# Patient Record
Sex: Female | Born: 1935
Health system: Southern US, Community
[De-identification: ages and names within clinical notes are randomized; demographics above are authoritative.]

## PROBLEM LIST (undated history)

## (undated) DIAGNOSIS — I272 Pulmonary hypertension, unspecified: Secondary | ICD-10-CM

## (undated) DIAGNOSIS — I10 Essential (primary) hypertension: Secondary | ICD-10-CM

## (undated) DIAGNOSIS — B029 Zoster without complications: Secondary | ICD-10-CM

## (undated) DIAGNOSIS — Z8619 Personal history of other infectious and parasitic diseases: Secondary | ICD-10-CM

## (undated) DIAGNOSIS — I2510 Atherosclerotic heart disease of native coronary artery without angina pectoris: Secondary | ICD-10-CM

## (undated) DIAGNOSIS — I5022 Chronic systolic (congestive) heart failure: Secondary | ICD-10-CM

## (undated) HISTORY — DX: Essential (primary) hypertension: I10

## (undated) HISTORY — DX: Atherosclerotic heart disease of native coronary artery without angina pectoris: I25.10

## (undated) HISTORY — DX: Zoster without complications: B02.9

## (undated) HISTORY — DX: Pulmonary hypertension, unspecified: I27.20

## (undated) HISTORY — DX: Personal history of other infectious and parasitic diseases: Z86.19

## (undated) HISTORY — DX: Chronic systolic (congestive) heart failure: I50.22

---
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
## 2011-03-04 ENCOUNTER — Ambulatory Visit (INDEPENDENT_AMBULATORY_CARE_PROVIDER_SITE_OTHER): Payer: Medicare Other | Admitting: Cardiovascular Disease

## 2011-03-04 ENCOUNTER — Encounter: Payer: Self-pay | Admitting: Cardiovascular Disease

## 2011-03-04 VITALS — BP 170/110 | HR 114 | Ht 64.0 in | Wt 159.0 lb

## 2011-03-04 DIAGNOSIS — R000 Tachycardia, unspecified: Secondary | ICD-10-CM | POA: Insufficient documentation

## 2011-03-04 DIAGNOSIS — I10 Essential (primary) hypertension: Secondary | ICD-10-CM

## 2011-03-04 DIAGNOSIS — R609 Edema, unspecified: Secondary | ICD-10-CM

## 2011-03-04 DIAGNOSIS — R0602 Shortness of breath: Secondary | ICD-10-CM | POA: Insufficient documentation

## 2011-03-04 DIAGNOSIS — I5022 Chronic systolic (congestive) heart failure: Secondary | ICD-10-CM

## 2011-03-04 DIAGNOSIS — I509 Heart failure, unspecified: Secondary | ICD-10-CM

## 2011-03-04 HISTORY — DX: Chronic systolic (congestive) heart failure: I50.22

## 2011-03-04 MED ORDER — FUROSEMIDE 20 MG PO TABS: 20.0000 mg | ORAL_TABLET | Freq: Two times a day (BID) | ORAL | Status: DC | PRN

## 2011-03-04 MED ORDER — METOPROLOL SUCCINATE ER 50 MG PO TB24: 50.0000 mg | ORAL_TABLET | Freq: Every day | ORAL | Status: DC

## 2011-03-04 NOTE — Assessment & Plan Note (Addendum)
Etiology of her congestive heart failure is still uncertain. We will order an echocardiogram to evaluate her cardiac function, including her valves and right heart pressures. Concerned about her tachycardia and hypertension.  Etiology of her symptoms is less likely underlying coronary artery disease given no significant family history and minimal risk factors.

## 2011-03-04 NOTE — Assessment & Plan Note (Signed)
Shortness of breath and edema have improved with diuresis. We have suggested she stay on Lasix 20 mg daily for now. We have given her a prescription for b.i.d. P.r.n. To provide extra as needed. Again etiology is uncertain at this time. She did endorse having a diet high in salt and fluids which she has changed over the past month.

## 2011-03-04 NOTE — Patient Instructions (Addendum)
You are doing well. Please increase the metoprolol to 50 mg daily  We have ordered an echocardiogram for shortness of breath and edema  Please call us if you have new issues that need to be addressed before your next appt.  Your physician wants you to follow-up in: 2 weeks You will receive a reminder letter in the mail two months in advance. If you don't receive a letter, please call our office to schedule the follow-up appointment.  Monitor your heart rate and blood pressure

## 2011-03-04 NOTE — Assessment & Plan Note (Signed)
Heart rate is elevated even later in the exam she continued with heart rate greater than 100. She certainly could have a tachycardia mediated cardiomyopathy. We have asked her to monitor her heart rate closely at home using her blood pressure cuff and pulse oximeter. She is unable to do this, we will order a Holter monitor. We'll increase her metoprolol succinate to 50 mg daily.

## 2011-03-04 NOTE — Assessment & Plan Note (Signed)
Blood pressure is very elevated today. We have asked her to use an alternate blood pressure cuff and to closely monitor her pressure at home. He does report some she is a very anxious person and this is pushing her blood pressure upwards.

## 2011-03-04 NOTE — Progress Notes (Signed)
Patient ID: Giana Castner, female    DOB: 1933/06/12, 78 y.o.   MRN: [id number]  HPI Comments: Ms. Dupas is a very pleasant 78 year old woman with no significant cardiac history, borderline hyperlipidemia, who presents via referral from Dr. Sherrie Mustache for symptoms of shortness of breath and edema.  She reports that her symptoms seemed to start in the middle of December. On December 19, she presented to urgent care which will shortness of breath and edema. She was started on Lasix 40 mg daily with a 9 pound weight loss. She had followup with Dr. Sherrie Mustache and her Lasix was decreased to 20 mg daily. She was also started on metoprolol and this was slowly increased to 37.5 mg daily of the succinate. Overall she is feeling much better, about 90% of her baseline. She denies any further lower extremity edema and no significant shortness of breath when lying supine.  Weight is now down 14 pounds from the middle of December when she had her symptoms.  She denies any chest pain, lightheadedness or dizziness. She has been monitoring her blood pressure at home using a wrist cuff and reports having adequate blood pressures in the 120-130 range systolic. She does not know her heart rate.  In the office today, pressures are very elevated with systolic pressure greater than 170 and diastolic pressure greater than 100 with heart rate greater than 100.  Blood pressure was also noted to be elevated when she was seen by Dr. Sherrie Mustache.  EKG shows sinus tachycardia with rate 114 beats per minute with poor R-wave progression through the anterior precordial leads, nonspecific ST abnormality, left axis deviation   Outpatient Encounter Prescriptions as of 03/04/2011  Medication Sig Dispense Refill  . aspirin 81 MG tablet Take 81 mg by mouth daily.       . Multiple Vitamins-Minerals (MULTIPLE VITAMINS/WOMENS PO) Take by mouth.      . furosemide (LASIX) 40 MG tablet Take 20 mg by mouth daily.      . D metoprolol succinate (TOPROL-XL)  25 MG 24 hr tablet Take 1 and 1/2 tablet daily.         Review of Systems  Constitutional: Negative.   HENT: Negative.   Eyes: Negative.   Respiratory: Negative.   Cardiovascular: Negative.   Gastrointestinal: Negative.   Musculoskeletal: Negative.   Skin: Negative.   Neurological: Negative.   Hematological: Negative.   Psychiatric/Behavioral: Negative.   All other systems reviewed and are negative.    BP 170/110  Pulse 114  Ht 5\' 4"  (1.626 m)  Wt 159 lb (72.122 kg)  BMI 27.29 kg/m2  Physical Exam  Nursing note and vitals reviewed. Constitutional: She is oriented to person, place, and time. She appears well-developed and well-nourished.  HENT:  Head: Normocephalic.  Nose: Nose normal.  Mouth/Throat: Oropharynx is clear and moist.  Eyes: Conjunctivae are normal. Pupils are equal, round, and reactive to light.  Neck: Normal range of motion. Neck supple. No JVD present.  Cardiovascular: Normal rate, regular rhythm, S1 normal, S2 normal, normal heart sounds and intact distal pulses.  Exam reveals no gallop and no friction rub.   No murmur heard. Pulmonary/Chest: Effort normal and breath sounds normal. No respiratory distress. She has no wheezes. She has no rales. She exhibits no tenderness.  Abdominal: Soft. Bowel sounds are normal. She exhibits no distension. There is no tenderness.  Musculoskeletal: Normal range of motion. She exhibits no edema and no tenderness.  Lymphadenopathy:    She has no cervical adenopathy.  Neurological: She is alert and oriented to person, place, and time. Coordination normal.  Skin: Skin is warm and dry. No rash noted. No erythema.  Psychiatric: She has a normal mood and affect. Her behavior is normal. Judgment and thought content normal.         Assessment and Plan

## 2011-03-17 ENCOUNTER — Other Ambulatory Visit (INDEPENDENT_AMBULATORY_CARE_PROVIDER_SITE_OTHER): Payer: Medicare Other | Admitting: *Deleted

## 2011-03-17 DIAGNOSIS — I429 Cardiomyopathy, unspecified: Secondary | ICD-10-CM

## 2011-03-17 DIAGNOSIS — I369 Nonrheumatic tricuspid valve disorder, unspecified: Secondary | ICD-10-CM

## 2011-03-17 DIAGNOSIS — R0602 Shortness of breath: Secondary | ICD-10-CM

## 2011-03-17 DIAGNOSIS — R609 Edema, unspecified: Secondary | ICD-10-CM

## 2011-03-17 MED ORDER — POTASSIUM CHLORIDE ER 10 MEQ PO TBCR: 20.0000 meq | EXTENDED_RELEASE_TABLET | Freq: Two times a day (BID) | ORAL | Status: DC

## 2011-03-17 NOTE — Patient Instructions (Signed)
Take Lasix (Furosemide) 20mg  TWICE a day. (One in AM & one in PM).  START Potassium 20 meq TWICE a day with the Lasix.  Follow up on Monday as already scheduled.

## 2011-03-23 ENCOUNTER — Encounter: Payer: Self-pay | Admitting: *Deleted

## 2011-03-23 ENCOUNTER — Ambulatory Visit (INDEPENDENT_AMBULATORY_CARE_PROVIDER_SITE_OTHER): Payer: Medicare Other | Admitting: Cardiovascular Disease

## 2011-03-23 VITALS — BP 169/119 | HR 96 | Ht 64.0 in | Wt 150.0 lb

## 2011-03-23 DIAGNOSIS — R609 Edema, unspecified: Secondary | ICD-10-CM

## 2011-03-23 DIAGNOSIS — I5020 Unspecified systolic (congestive) heart failure: Secondary | ICD-10-CM

## 2011-03-23 DIAGNOSIS — R000 Tachycardia, unspecified: Secondary | ICD-10-CM

## 2011-03-23 DIAGNOSIS — I10 Essential (primary) hypertension: Secondary | ICD-10-CM

## 2011-03-23 DIAGNOSIS — R0602 Shortness of breath: Secondary | ICD-10-CM

## 2011-03-23 MED ORDER — LISINOPRIL 20 MG PO TABS: 20.0000 mg | ORAL_TABLET | Freq: Every day | ORAL | Status: DC

## 2011-03-23 MED ORDER — METOPROLOL SUCCINATE ER 50 MG PO TB24: 50.0000 mg | ORAL_TABLET | Freq: Two times a day (BID) | ORAL | Status: DC

## 2011-03-23 NOTE — Assessment & Plan Note (Signed)
Edema has improved with significant weight loss over 2 months.

## 2011-03-23 NOTE — Progress Notes (Signed)
Patient ID: Anna Weaver, female    DOB: 27-Mar-1933, 78 y.o.   MRN: [id number]  HPI Comments: Anna Weaver is a very pleasant 77 year old woman with no significant cardiac history, borderline hyperlipidemia, who presents via referral from Dr. Sherrie Mustache for symptoms of shortness of breath and edema. She was found to be in systolic CHF, started on Lasix with a 20 pound weight loss over the past 2 months.  Possible hypertensive cardiomyopathy. No significant risk factors. Recent echocardiogram last week showed ejection fraction less than 25%. Right ventricular systolic pressures were significantly elevated, estimated at 60 mm of mercury or higher.  She has had 9 pound weight loss since her last clinic visit over 2 weeks ago. She feels well. She denies any significant lower extremity edema, normal swelling or cough. Overall she has no complaints. She is not very active.  She denies any chest pain, lightheadedness or dizziness. She has been monitoring her blood pressure at home using a wrist cuff and reports having adequate blood pressures in the 130 range systolic.  Recent lab work showed total cholesterol 205, LDL 119, triglycerides 132, HDL 60   Outpatient Encounter Prescriptions as of 03/23/2011  Medication Sig Dispense Refill  . aspirin 81 MG tablet Take 81 mg by mouth daily.       . furosemide (LASIX) 20 MG tablet Take 1 tablet (20 mg total) by mouth 2 (two) times daily.  180 tablet  3  . Multiple Vitamins-Minerals (MULTIPLE VITAMINS/WOMENS PO) Take by mouth.      . potassium chloride (K-DUR) 10 MEQ tablet Take 2 tablets (20 mEq total) by mouth 2 (two) times daily.  120 tablet  3  . metoprolol succinate (TOPROL-XL) 50 MG 24 hr tablet Take 1 tablet (50 mg total) by mouth daily.  30 tablet  6      Review of Systems  Constitutional: Negative.   HENT: Negative.   Eyes: Negative.   Respiratory: Negative.   Cardiovascular: Negative.   Gastrointestinal: Negative.   Musculoskeletal: Negative.     Skin: Negative.   Neurological: Negative.   Hematological: Negative.   Psychiatric/Behavioral: Negative.   All other systems reviewed and are negative.    BP 169/119  Pulse 96  Ht 5\' 4"  (1.626 m)  Wt 150 lb (68.04 kg)  BMI 25.75 kg/m2  Physical Exam  Nursing note and vitals reviewed. Constitutional: She is oriented to person, place, and time. She appears well-developed and well-nourished.  HENT:  Head: Normocephalic.  Nose: Nose normal.  Mouth/Throat: Oropharynx is clear and moist.  Eyes: Conjunctivae are normal. Pupils are equal, round, and reactive to light.  Neck: Normal range of motion. Neck supple. No JVD present.  Cardiovascular: Normal rate, regular rhythm, S1 normal, S2 normal, normal heart sounds and intact distal pulses.  Exam reveals no gallop and no friction rub.   No murmur heard. Pulmonary/Chest: Effort normal and breath sounds normal. No respiratory distress. She has no wheezes. She has no rales. She exhibits no tenderness.  Abdominal: Soft. Bowel sounds are normal. She exhibits no distension. There is no tenderness.  Musculoskeletal: Normal range of motion. She exhibits no edema and no tenderness.  Lymphadenopathy:    She has no cervical adenopathy.  Neurological: She is alert and oriented to person, place, and time. Coordination normal.  Skin: Skin is warm and dry. No rash noted. No erythema.  Psychiatric: She has a normal mood and affect. Her behavior is normal. Judgment and thought content normal.  Assessment and Plan

## 2011-03-23 NOTE — Assessment & Plan Note (Signed)
Heart rate has improved as her heart failure has improved.

## 2011-03-23 NOTE — Assessment & Plan Note (Signed)
Will start lisinopril and increase metoprolol for blood pressure control.

## 2011-03-23 NOTE — Assessment & Plan Note (Addendum)
Etiology of her heart failure is uncertain. She is reluctant to schedule a stress test at this time.  She has few risk factors for coronary artery disease. My suspicion is she has a nonischemic cardiopathy, possibly hypertensive. We will start lisinopril 20 mg daily, continue her on aggressive diuresis given the echo finding showing elevated right ventricular systolic pressure ( double what it should be), and we will increase the metoprolol succinate 50 mg in the morning and 25 mg at night.  We will check a basic metabolic panel today and BNP

## 2011-03-23 NOTE — Patient Instructions (Addendum)
You are doing well. Please start lisinopril 20 mg daily  In about two weeks, add metoprolol 1/2 in the PM Goal weight is 145 pounds  Check your blood pressure at home and write it down.  Please call us if you have new issues that need to be addressed before your next appt.  Your physician wants you to follow-up in: 2 months.  You will receive a reminder letter in the mail two months in advance. If you don't receive a letter, please call our office to schedule the follow-up appointment.

## 2011-03-24 LAB — BASIC METABOLIC PANEL: Glucose: 124 mg/dL — ABNORMAL HIGH (ref 65–99)

## 2011-03-31 ENCOUNTER — Telehealth: Payer: Self-pay | Admitting: Cardiovascular Disease

## 2011-03-31 NOTE — Telephone Encounter (Signed)
Can we figure out where her BMP and BNP went? Was it ordered wrong? May need to repeat if not available

## 2011-03-31 NOTE — Telephone Encounter (Signed)
Calling for lab results. °

## 2011-03-31 NOTE — Telephone Encounter (Signed)
Calling for lab results.  The only lab in the computer is the glucose.  Do you have a bmet or bnp for her that is not in the computer?  Thanks.

## 2011-04-01 ENCOUNTER — Other Ambulatory Visit: Payer: Self-pay

## 2011-04-01 ENCOUNTER — Encounter: Payer: Self-pay | Admitting: Cardiovascular Disease

## 2011-04-01 DIAGNOSIS — R0602 Shortness of breath: Secondary | ICD-10-CM

## 2011-04-01 NOTE — Telephone Encounter (Signed)
Notified patient per Dr. Mariah Milling need to have a BNP today.  She was sent to [facility] for a BNP.

## 2011-04-01 NOTE — Telephone Encounter (Signed)
N/A.  LMTC. 

## 2011-04-01 NOTE — Telephone Encounter (Signed)
No need to draw a new BNP I am on with the BMP we have Would continue lasix as she is taking. Labs do not suggest dehydration. Can push weight a little lower.

## 2011-04-01 NOTE — Telephone Encounter (Signed)
PT'S DAUGHTER  AWARE OF   LAB  RESULTS./CY 

## 2011-04-01 NOTE — Telephone Encounter (Signed)
The BMP is now available but the patient will need to have the BNP done.

## 2011-04-02 ENCOUNTER — Telehealth: Payer: Self-pay | Admitting: *Deleted

## 2011-04-02 ENCOUNTER — Encounter: Payer: Self-pay | Admitting: Cardiovascular Disease

## 2011-04-02 DIAGNOSIS — R0602 Shortness of breath: Secondary | ICD-10-CM

## 2011-04-02 DIAGNOSIS — Z01810 Encounter for preprocedural cardiovascular examination: Secondary | ICD-10-CM

## 2011-04-02 NOTE — Telephone Encounter (Signed)
[name], RN called to let Dr. Mariah Milling know that [name] lab results for her bnp were in and her bnp is 2509. I told Eunice Blase that I will print off lab and show to Dr. Mariah Milling today to address. Danielle Rankin

## 2011-04-29 ENCOUNTER — Ambulatory Visit (INDEPENDENT_AMBULATORY_CARE_PROVIDER_SITE_OTHER): Payer: Medicare Other | Admitting: Cardiovascular Disease

## 2011-04-29 ENCOUNTER — Encounter: Payer: Self-pay | Admitting: Cardiovascular Disease

## 2011-04-29 VITALS — BP 179/103 | HR 79 | Wt 147.0 lb

## 2011-04-29 DIAGNOSIS — I428 Other cardiomyopathies: Secondary | ICD-10-CM

## 2011-04-29 DIAGNOSIS — I10 Essential (primary) hypertension: Secondary | ICD-10-CM

## 2011-04-29 DIAGNOSIS — R0602 Shortness of breath: Secondary | ICD-10-CM

## 2011-04-29 DIAGNOSIS — I5020 Unspecified systolic (congestive) heart failure: Secondary | ICD-10-CM

## 2011-04-29 DIAGNOSIS — R000 Tachycardia, unspecified: Secondary | ICD-10-CM

## 2011-04-29 MED ORDER — LISINOPRIL 40 MG PO TABS: 40.0000 mg | ORAL_TABLET | Freq: Every day | ORAL | Status: DC

## 2011-04-29 NOTE — Patient Instructions (Addendum)
You are doing well. Please increase the lisinopril to 40 mg daily (or 20 mg twice a day)  We will check some blood work today We will also set up a stress test to look for heart blockage You will be scheduled for a [employer] at [facility].  ECHO should be done prior to your next clinic visit to look at heart function  Please call us if you have new issues that need to be addressed before your next appt.  Your physician wants you to follow-up in: 3 months.  You will receive a reminder letter in the mail two months in advance. If you don't receive a letter, please call our office to schedule the follow-up appointment.

## 2011-04-29 NOTE — Progress Notes (Signed)
Patient ID: Anna Weaver, female    DOB: 03-12-1933, 78 y.o.   MRN: [id number]  HPI Comments: Anna Weaver is a very pleasant 78 year old woman with no significant cardiac history, borderline hyperlipidemia, who presents via referral from Dr. Sherrie Mustache for symptoms of shortness of breath and edema. She was found to be in systolic CHF, started on Lasix with a 20 pound weight loss over the past 2 months. Possible hypertensive or viral cardiomyopathy. No significant risk factors for CAD. Recent echocardiogram showed ejection fraction less than 25%. Right ventricular systolic pressures were significantly elevated, estimated at 60 mm of mercury or higher.  She has had initial  9 pound weight loss on her last visit. 3 lb weight loss since her last visit.  She feels well. She denies any significant lower extremity edema, normal swelling or cough. Overall she has no complaints. She is not very active.  She denies any chest pain, lightheadedness or dizziness. She has been monitoring her blood pressure at home using a wrist cuff and reports having adequate blood pressures in the 120 to 130 range systolic.   lab work showed total cholesterol 205, LDL 119, triglycerides 132, HDL 60     Outpatient Encounter Prescriptions as of 04/29/2011  Medication Sig Dispense Refill  . aspirin 81 MG tablet Take 81 mg by mouth daily.       . furosemide (LASIX) 20 MG tablet Take 1 tablet (20 mg total) by mouth 2 (two) times daily.  180 tablet  3  . lisinopril (PRINIVIL,ZESTRIL) 40 MG tablet Take 1 tablet (40 mg total) by mouth daily.  90 tablet  3  . metoprolol succinate (TOPROL-XL) 50 MG 24 hr tablet Take by mouth. Take 1 tablet in the am and 1 & 1/2 tablet in the pm      . Multiple Vitamins-Minerals (MULTIPLE VITAMINS/WOMENS PO) Take by mouth.      . potassium chloride (K-DUR) 10 MEQ tablet Take 2 tablets (20 mEq total) by mouth 2 (two) times daily.  120 tablet  3  .  lisinopril (PRINIVIL,ZESTRIL) 20 MG tablet Take 1 tablet  (20 mg total) by mouth daily.  30 tablet  11     Review of Systems  Constitutional: Negative.   HENT: Negative.   Eyes: Negative.   Respiratory: Negative.   Cardiovascular: Negative.   Gastrointestinal: Negative.   Musculoskeletal: Negative.   Skin: Negative.   Neurological: Negative.   Hematological: Negative.   Psychiatric/Behavioral: Negative.   All other systems reviewed and are negative.    BP 179/103  Pulse 79  Wt 147 lb (66.679 kg)  Physical Exam  Nursing note and vitals reviewed. Constitutional: She is oriented to person, place, and time. She appears well-developed and well-nourished.  HENT:  Head: Normocephalic.  Nose: Nose normal.  Mouth/Throat: Oropharynx is clear and moist.  Eyes: Conjunctivae are normal. Pupils are equal, round, and reactive to light.  Neck: Normal range of motion. Neck supple. No JVD present.  Cardiovascular: Normal rate, regular rhythm, S1 normal, S2 normal, normal heart sounds and intact distal pulses.  Exam reveals no gallop and no friction rub.   No murmur heard. Pulmonary/Chest: Effort normal and breath sounds normal. No respiratory distress. She has no wheezes. She has no rales. She exhibits no tenderness.  Abdominal: Soft. Bowel sounds are normal. She exhibits no distension. There is no tenderness.  Musculoskeletal: Normal range of motion. She exhibits no edema and no tenderness.  Lymphadenopathy:    She has no cervical adenopathy.  Neurological: She is alert and oriented to person, place, and time. Coordination normal.  Skin: Skin is warm and dry. No rash noted. No erythema.  Psychiatric: She has a normal mood and affect. Her behavior is normal. Judgment and thought content normal.         Assessment and Plan

## 2011-04-29 NOTE — Assessment & Plan Note (Signed)
We will check basic metabolic panel today given recent weight loss. She is concerned about too much diuretic and weight loss. If creatinine and BUN started to climb, we will back down on her diuretic regimen.   We will schedule her for a stress test to rule out ischemia as a cause of her cardiomyopathy.   We will repeat echocardiogram in 3 months. His ejection fraction continues to be less than 35%, she may benefit from referral to EP for possible ICD.

## 2011-04-29 NOTE — Assessment & Plan Note (Signed)
Hypertension appeared to be a problem on today's visit though she reports and has documentation of blood pressures at home with systolic pressures in the 120-130 range. I feel she gets anxious in the office. We will increase her lisinopril to 40 mg daily.

## 2011-04-29 NOTE — Assessment & Plan Note (Signed)
Tachycardia has significantly improved on today's visit likely secondary to improved CHF.

## 2011-04-30 LAB — BASIC METABOLIC PANEL
BUN/Creatinine Ratio: 19 (ref 11–26)
BUN: 19 mg/dL (ref 8–27)
CO2: 24 mmol/L (ref 20–32)
Chloride: 101 mmol/L (ref 97–108)
Glucose: 110 mg/dL — ABNORMAL HIGH (ref 65–99)
Potassium: 4.3 mmol/L (ref 3.5–5.2)

## 2011-05-05 ENCOUNTER — Ambulatory Visit: Payer: Self-pay | Admitting: Cardiovascular Disease

## 2011-05-05 DIAGNOSIS — R0602 Shortness of breath: Secondary | ICD-10-CM

## 2011-05-08 ENCOUNTER — Encounter: Payer: Self-pay | Admitting: Cardiovascular Disease

## 2011-05-08 ENCOUNTER — Ambulatory Visit (INDEPENDENT_AMBULATORY_CARE_PROVIDER_SITE_OTHER): Payer: Medicare Other | Admitting: Cardiovascular Disease

## 2011-05-08 VITALS — BP 164/99 | HR 78 | Ht 64.0 in | Wt 146.0 lb

## 2011-05-08 DIAGNOSIS — I10 Essential (primary) hypertension: Secondary | ICD-10-CM

## 2011-05-08 DIAGNOSIS — E785 Hyperlipidemia, unspecified: Secondary | ICD-10-CM

## 2011-05-08 DIAGNOSIS — I5020 Unspecified systolic (congestive) heart failure: Secondary | ICD-10-CM

## 2011-05-08 DIAGNOSIS — E782 Mixed hyperlipidemia: Secondary | ICD-10-CM | POA: Insufficient documentation

## 2011-05-08 DIAGNOSIS — I2510 Atherosclerotic heart disease of native coronary artery without angina pectoris: Secondary | ICD-10-CM

## 2011-05-08 DIAGNOSIS — I25118 Atherosclerotic heart disease of native coronary artery with other forms of angina pectoris: Secondary | ICD-10-CM | POA: Insufficient documentation

## 2011-05-08 MED ORDER — CLOPIDOGREL BISULFATE 75 MG PO TABS: 75.0000 mg | ORAL_TABLET | Freq: Every day | ORAL | Status: DC

## 2011-05-08 MED ORDER — ATORVASTATIN CALCIUM 20 MG PO TABS: 20.0000 mg | ORAL_TABLET | Freq: Every day | ORAL | Status: DC

## 2011-05-08 NOTE — Assessment & Plan Note (Signed)
We'll continue her on beta blocker, ACE inhibitor, diuretic. She feels well today. She will decrease her Lasix to one a day.

## 2011-05-08 NOTE — Assessment & Plan Note (Signed)
Large region of perfusion defect and ischemia seen in the anterior wall on stress test. This is likely the reason for her depressed ejection fraction. We will set up a cardiac catheterization at her convenience at [facility].  We will start Plavix 75 mg daily in addition to aspirin 81 mg daily.

## 2011-05-08 NOTE — Progress Notes (Signed)
   Patient ID: Anna Weaver, female    DOB: 1933/12/07, 78 y.o.   MRN: [id number]  HPI Comments: Ms. Ozanich is a very pleasant 78 year old woman with  borderline hyperlipidemia, patient of Dr. Sherrie Mustache, with recent symptoms of shortness of breath and edema, found to be in systolic CHF, started on Lasix with a significant weight loss over the past several months months.   No significant risk factors.   echocardiogram showed ejection fraction less than 25%. Right ventricular systolic pressures were significantly elevated, estimated at 60 mm of mercury or higher.  Recent stress test showing anterior perfusion wall defect, ischemia, depressed ejection fraction consistent with underlying coronary artery disease of the LAD.  She feels well and has no complaints of chest pain. Shortness of breath is better after diuresis. Her weight continues to decline slowly.  Recent lab work showed total cholesterol 205, LDL 119, triglycerides 132, HDL 60   Outpatient Encounter Prescriptions as of 05/08/2011  Medication Sig Dispense Refill  . aspirin 81 MG tablet Take 81 mg by mouth daily.       . furosemide (LASIX) 20 MG tablet Take 1 tablet (20 mg total) by mouth 2 (two) times daily.  180 tablet  3  . lisinopril (PRINIVIL,ZESTRIL) 40 MG tablet Take 1 tablet (40 mg total) by mouth daily.  90 tablet  3  . metoprolol succinate (TOPROL-XL) 50 MG 24 hr tablet Take by mouth. Take 1 tablet in the am and 1 & 1/2 tablet in the pm      . Multiple Vitamins-Minerals (MULTIPLE VITAMINS/WOMENS PO) Take by mouth.      . potassium chloride (K-DUR) 10 MEQ tablet Take 2 tablets (20 mEq total) by mouth 2 (two) times daily.  120 tablet  3    Review of Systems  Constitutional: Negative.   HENT: Negative.   Eyes: Negative.   Respiratory: Negative.   Cardiovascular: Negative.   Gastrointestinal: Negative.   Musculoskeletal: Negative.   Skin: Negative.   Neurological: Negative.   Hematological: Negative.   Psychiatric/Behavioral:  Negative.   All other systems reviewed and are negative.    BP 164/99  Pulse 78  Ht 5\' 4"  (1.626 m)  Wt 146 lb (66.225 kg)  BMI 25.06 kg/m2  Physical Exam  Nursing note and vitals reviewed. Constitutional: She is oriented to person, place, and time. She appears well-developed and well-nourished.  HENT:  Head: Normocephalic.  Nose: Nose normal.  Mouth/Throat: Oropharynx is clear and moist.  Eyes: Conjunctivae are normal. Pupils are equal, round, and reactive to light.  Neck: Normal range of motion. Neck supple. No JVD present.  Cardiovascular: Normal rate, regular rhythm, S1 normal, S2 normal, normal heart sounds and intact distal pulses.  Exam reveals no gallop and no friction rub.   No murmur heard. Pulmonary/Chest: Effort normal and breath sounds normal. No respiratory distress. She has no wheezes. She has no rales. She exhibits no tenderness.  Abdominal: Soft. Bowel sounds are normal. She exhibits no distension. There is no tenderness.  Musculoskeletal: Normal range of motion. She exhibits no edema and no tenderness.  Lymphadenopathy:    She has no cervical adenopathy.  Neurological: She is alert and oriented to person, place, and time. Coordination normal.  Skin: Skin is warm and dry. No rash noted. No erythema.  Psychiatric: She has a normal mood and affect. Her behavior is normal. Judgment and thought content normal.         Assessment and Plan

## 2011-05-08 NOTE — Patient Instructions (Addendum)
You are doing well. Please start plavix one a day (in addition to aspirin 81 mg once a day) Please start lipitor 20 mg a day  Please call us if you have new issues that need to be addressed before your next appt.  We will schedule a cardiac cath

## 2011-05-08 NOTE — Assessment & Plan Note (Signed)
Blood pressure continues to be elevated. She is anxious today. We have made no medication changes and will monitor her blood pressure.

## 2011-05-08 NOTE — Assessment & Plan Note (Signed)
Given likely coronary artery disease, we'll start Lipitor 20 mg daily. Goal LDL less than 70.

## 2011-05-11 ENCOUNTER — Other Ambulatory Visit: Payer: Self-pay

## 2011-05-11 DIAGNOSIS — R0602 Shortness of breath: Secondary | ICD-10-CM

## 2011-05-11 DIAGNOSIS — Z5181 Encounter for therapeutic drug level monitoring: Secondary | ICD-10-CM

## 2011-05-11 DIAGNOSIS — Z01818 Encounter for other preprocedural examination: Secondary | ICD-10-CM

## 2011-05-11 DIAGNOSIS — Z01810 Encounter for preprocedural cardiovascular examination: Secondary | ICD-10-CM

## 2011-05-21 ENCOUNTER — Ambulatory Visit (INDEPENDENT_AMBULATORY_CARE_PROVIDER_SITE_OTHER): Payer: Medicare Other

## 2011-05-21 DIAGNOSIS — Z01818 Encounter for other preprocedural examination: Secondary | ICD-10-CM

## 2011-05-21 DIAGNOSIS — R0602 Shortness of breath: Secondary | ICD-10-CM

## 2011-05-21 DIAGNOSIS — Z5181 Encounter for therapeutic drug level monitoring: Secondary | ICD-10-CM

## 2011-05-21 DIAGNOSIS — Z01810 Encounter for preprocedural cardiovascular examination: Secondary | ICD-10-CM

## 2011-05-22 LAB — BASIC METABOLIC PANEL
CO2: 25 mmol/L (ref 20–32)
Calcium: 9.9 mg/dL (ref 8.6–10.2)
Chloride: 101 mmol/L (ref 97–108)
GFR calc non Af Amer: 52 mL/min/{1.73_m2} — ABNORMAL LOW (ref >59)
Potassium: 3.9 mmol/L (ref 3.5–5.2)
Sodium: 139 mmol/L (ref 134–144)

## 2011-05-22 LAB — CBC WITH DIFFERENTIAL/PLATELET
Basophils Absolute: 0 10*3/uL (ref 0.0–0.2)
Eosinophils Absolute: 0 10*3/uL (ref 0.0–0.4)
Immature Grans (Abs): 0 10*3/uL (ref 0.0–0.1)
Immature Granulocytes: 0 % (ref 0–2)
MCH: 30 pg (ref 26.6–33.0)
MCV: 87 fL (ref 79–97)
Monocytes Absolute: 0.4 10*3/uL (ref 0.1–1.0)
Neutrophils Relative %: 66 % (ref 40–74)
RDW: 14.7 % (ref 12.3–15.4)

## 2011-05-29 ENCOUNTER — Encounter: Payer: Self-pay | Admitting: Cardiovascular Disease

## 2011-05-29 ENCOUNTER — Ambulatory Visit: Payer: Self-pay | Admitting: Cardiovascular Disease

## 2011-05-29 DIAGNOSIS — R079 Chest pain, unspecified: Secondary | ICD-10-CM

## 2011-05-29 HISTORY — PX: CARDIAC CATHETERIZATION: SHX172

## 2011-06-03 ENCOUNTER — Ambulatory Visit (INDEPENDENT_AMBULATORY_CARE_PROVIDER_SITE_OTHER): Payer: Medicare Other | Admitting: Cardiovascular Disease

## 2011-06-03 ENCOUNTER — Encounter: Payer: Self-pay | Admitting: Cardiovascular Disease

## 2011-06-03 VITALS — BP 168/78 | HR 69 | Ht 64.0 in | Wt 147.0 lb

## 2011-06-03 DIAGNOSIS — E785 Hyperlipidemia, unspecified: Secondary | ICD-10-CM

## 2011-06-03 DIAGNOSIS — I10 Essential (primary) hypertension: Secondary | ICD-10-CM

## 2011-06-03 DIAGNOSIS — I5020 Unspecified systolic (congestive) heart failure: Secondary | ICD-10-CM

## 2011-06-03 DIAGNOSIS — I2510 Atherosclerotic heart disease of native coronary artery without angina pectoris: Secondary | ICD-10-CM

## 2011-06-03 MED ORDER — ATORVASTATIN CALCIUM 20 MG PO TABS: 20.0000 mg | ORAL_TABLET | Freq: Every day | ORAL | Status: DC

## 2011-06-03 NOTE — Assessment & Plan Note (Signed)
She is currently on Lipitor 20 mg daily. Goal LDL less than 70. We will check cholesterol at the end of June

## 2011-06-03 NOTE — Assessment & Plan Note (Signed)
Blood pressure is elevated on today's visit though she reports adequate numbers at home. We have not made any medication changes today and have suggested she monitor her blood pressure.

## 2011-06-03 NOTE — Assessment & Plan Note (Signed)
Occluded LAD by cardiac catheterization. Collaterals from left to left and right-to-left. Continue aggressive cholesterol management and heart failure medications.

## 2011-06-03 NOTE — Assessment & Plan Note (Signed)
Ejection fraction has improved to 40% by cardiac catheterization.  Currently with no symptoms. We'll continue Lasix 20 mg twice a day. Goal weight is 143 pounds to 147 pounds.

## 2011-06-03 NOTE — Patient Instructions (Addendum)
You are doing well. Decrease the potassium to one (10 meq) twice a day  If your weight drops below 145 lbs, hold the lasix and potassium  Goal weight is between 143 and 147  We will check your lipids at the end of June We will check a BMP in June as well. We will try to keep the blood pressure <140 on the top, <90 on the bottom  Please call us if you have new issues that need to be addressed before your next appt.  Your physician wants you to follow-up in: 6 months.  You will receive a reminder letter in the mail two months in advance. If you don't receive a letter, please call our office to schedule the follow-up appointment.

## 2011-06-03 NOTE — Progress Notes (Signed)
Patient ID: Anna Weaver, female    DOB: 02/03/1934, 78 y.o.   MRN: [id number]  HPI Comments: Ms. [name] is a very pleasant 78 year old woman with  borderline hyperlipidemia, patient of Dr. Sherrie Mustache, with recent symptoms of shortness of breath and edema in Jan 2013, found to be in systolic CHF EF 25%, started on Lasix with a significant weight loss over the past several months,  Stress test showing anterior wall ischemia, with recent cardiac catheterization at [facility] on 05/29/2011 showing occluded proximal LAD with collateral vessels, also 60% OM 2 disease, ejection fraction 40%.  She presents for followup after her recent cardiac catheterization. She reports that her leg healed well and she has no complaints. Her weight has been relatively stable in the 145 range. She takes Lasix twice a day with potassium 20 mEq twice a day. She has been active with no significant shortness of breath. Overall she feels well and has no complaints. Blood pressure at home has been in the 120 range systolic.  Previous echocardiogram showed ejection fraction less than 25%. Right ventricular systolic pressures were significantly elevated, estimated at 60 mm of mercury or higher.  Recent stress test showing anterior perfusion wall defect, ischemia, depressed ejection fraction consistent with underlying coronary artery disease of the LAD.  Previous lab work showed total cholesterol 205, LDL 119, triglycerides 132, HDL 60 EKG shows normal sinus rhythm with rate 69 beats per minute with nonspecific ST changes in the inferior and lateral leads, poor R wave progression to the anterior precordial leads, consider old anterior MI.   Outpatient Encounter Prescriptions as of 06/03/2011  Medication Sig Dispense Refill  . aspirin 81 MG tablet Take 81 mg by mouth daily.       Marland Kitchen atorvastatin (LIPITOR) 20 MG tablet Take 1 tablet (20 mg total) by mouth daily.  90 tablet  4  . furosemide (LASIX) 20 MG tablet Take 1 tablet (20 mg total) by  mouth 2 (two) times daily.  180 tablet  3  . lisinopril (PRINIVIL,ZESTRIL) 40 MG tablet Take 1 tablet (40 mg total) by mouth daily.  90 tablet  3  . metoprolol succinate (TOPROL-XL) 50 MG 24 hr tablet Take by mouth. Take 1 tablet in the am and 1 & 1/2 tablet in the pm      . Multiple Vitamins-Minerals (MULTIPLE VITAMINS/WOMENS PO) Take by mouth.      . potassium chloride (K-DUR) 20 MEQ tablet Take 1 tablet (10 mEq total) by mouth 2 (two) times daily.  60 tablet  3   Review of Systems  Constitutional: Negative.   HENT: Negative.   Eyes: Negative.   Respiratory: Negative.   Cardiovascular: Negative.   Gastrointestinal: Negative.   Musculoskeletal: Negative.   Skin: Negative.   Neurological: Negative.   Hematological: Negative.   Psychiatric/Behavioral: Negative.   All other systems reviewed and are negative.    BP 168/78  Pulse 69  Ht 5\' 4"  (1.626 m)  Wt 147 lb (66.679 kg)  BMI 25.23 kg/m2  Physical Exam  Nursing note and vitals reviewed. Constitutional: She is oriented to person, place, and time. She appears well-developed and well-nourished.  HENT:  Head: Normocephalic.  Nose: Nose normal.  Mouth/Throat: Oropharynx is clear and moist.  Eyes: Conjunctivae are normal. Pupils are equal, round, and reactive to light.  Neck: Normal range of motion. Neck supple. No JVD present.  Cardiovascular: Normal rate, regular rhythm, S1 normal, S2 normal, normal heart sounds and intact distal pulses.  Exam reveals no gallop  and no friction rub.   No murmur heard. Pulmonary/Chest: Effort normal and breath sounds normal. No respiratory distress. She has no wheezes. She has no rales. She exhibits no tenderness.  Abdominal: Soft. Bowel sounds are normal. She exhibits no distension. There is no tenderness.  Musculoskeletal: Normal range of motion. She exhibits no edema and no tenderness.  Lymphadenopathy:    She has no cervical adenopathy.  Neurological: She is alert and oriented to person,  place, and time. Coordination normal.  Skin: Skin is warm and dry. No rash noted. No erythema.  Psychiatric: She has a normal mood and affect. Her behavior is normal. Judgment and thought content normal.         Assessment and Plan

## 2011-06-15 ENCOUNTER — Encounter: Payer: Self-pay | Admitting: Cardiovascular Disease

## 2011-07-28 ENCOUNTER — Other Ambulatory Visit: Payer: Medicare Other

## 2011-07-31 ENCOUNTER — Ambulatory Visit: Payer: Medicare Other | Admitting: Cardiovascular Disease

## 2011-08-03 ENCOUNTER — Other Ambulatory Visit: Payer: Medicare Other

## 2011-08-18 ENCOUNTER — Ambulatory Visit (INDEPENDENT_AMBULATORY_CARE_PROVIDER_SITE_OTHER): Payer: Medicare Other

## 2011-08-18 DIAGNOSIS — E785 Hyperlipidemia, unspecified: Secondary | ICD-10-CM

## 2011-08-18 DIAGNOSIS — I2510 Atherosclerotic heart disease of native coronary artery without angina pectoris: Secondary | ICD-10-CM

## 2011-08-19 LAB — HEPATIC FUNCTION PANEL
Alkaline Phosphatase: 108 IU/L (ref 25–165)
Bilirubin, Direct: 0.13 mg/dL (ref 0.00–0.40)
Total Bilirubin: 0.4 mg/dL (ref 0.0–1.2)
Total Protein: 6.6 g/dL (ref 6.0–8.5)

## 2011-08-19 LAB — LIPID PANEL
Chol/HDL Ratio: 2.6 ratio units (ref 0.0–4.4)
HDL: 64 mg/dL (ref >39)
Triglycerides: 85 mg/dL (ref 0–149)

## 2011-08-25 ENCOUNTER — Other Ambulatory Visit: Payer: Self-pay | Admitting: Cardiovascular Disease

## 2011-08-25 NOTE — Telephone Encounter (Signed)
Refill sent for potassuim

## 2011-08-31 ENCOUNTER — Telehealth: Payer: Self-pay

## 2011-08-31 NOTE — Telephone Encounter (Signed)
Sounds good

## 2011-08-31 NOTE — Telephone Encounter (Signed)
See below  Pt called wanting lab results that were drawn 2 weeks ago. I gave her preliminary results and explained these looked good, to stay on atorvastatin for cholesterol.,  She also requests that I mail her these results.    She then tells me she is taking metoprolol 1 tab in am and 1.5 tabs in PM.  She asks if she can change to 1 tab BID since the 0.5 tablet is causing issues with how many pills she gets from pharmacy. She tells me that Dr. Mariah Milling stated her "norm" for BP is 140/90 or less. She checks BP at home and confirms these are the #s she has been getting. I advsied her to try taking just metoprolol 1 tab BID and continue to monitor BP. If her BP readings remain 140/90 or less we may be able to keep her on the 1 tab BID dosing.  She will do this and call us next week with readings.

## 2011-09-07 ENCOUNTER — Telehealth: Payer: Self-pay

## 2011-09-07 MED ORDER — METOPROLOL SUCCINATE ER 50 MG PO TB24: ORAL_TABLET | ORAL | Status: DC

## 2011-09-07 MED ORDER — POTASSIUM CHLORIDE ER 10 MEQ PO TBCR: 10.0000 meq | EXTENDED_RELEASE_TABLET | Freq: Two times a day (BID) | ORAL | Status: DC

## 2011-09-07 NOTE — Telephone Encounter (Signed)
Pt says she tried taking metoprolol 1 tab BID (as discussed last week) instead of 1 tab in am 1 1/2 tabs in pm.  She says BP has been ok but she has been having some dizzy spells in am. She says she  wishes to just go back on the metoprolol 1 tab in am 1 1/2 tabs in pm and wants me to send new RX to pharm with correct amt of tabs.  I told her I would do this now.

## 2011-09-07 NOTE — Telephone Encounter (Signed)
Pt would like me to call her back

## 2011-12-09 ENCOUNTER — Encounter: Payer: Self-pay | Admitting: Cardiovascular Disease

## 2011-12-09 ENCOUNTER — Ambulatory Visit (INDEPENDENT_AMBULATORY_CARE_PROVIDER_SITE_OTHER): Payer: Medicare Other | Admitting: Cardiovascular Disease

## 2011-12-09 VITALS — BP 164/100 | HR 76 | Ht 64.0 in | Wt 149.8 lb

## 2011-12-09 DIAGNOSIS — E785 Hyperlipidemia, unspecified: Secondary | ICD-10-CM

## 2011-12-09 DIAGNOSIS — R609 Edema, unspecified: Secondary | ICD-10-CM

## 2011-12-09 DIAGNOSIS — I2510 Atherosclerotic heart disease of native coronary artery without angina pectoris: Secondary | ICD-10-CM

## 2011-12-09 DIAGNOSIS — I10 Essential (primary) hypertension: Secondary | ICD-10-CM

## 2011-12-09 DIAGNOSIS — R0602 Shortness of breath: Secondary | ICD-10-CM

## 2011-12-09 MED ORDER — ATORVASTATIN CALCIUM 40 MG PO TABS: 40.0000 mg | ORAL_TABLET | Freq: Every day | ORAL | Status: DC

## 2011-12-09 MED ORDER — LISINOPRIL 40 MG PO TABS: 40.0000 mg | ORAL_TABLET | Freq: Every day | ORAL | Status: DC

## 2011-12-09 MED ORDER — FUROSEMIDE 20 MG PO TABS: 20.0000 mg | ORAL_TABLET | Freq: Two times a day (BID) | ORAL | Status: DC

## 2011-12-09 NOTE — Assessment & Plan Note (Signed)
We'll increase the Lipitor to 40 mg daily with goal LDL less than 70.

## 2011-12-09 NOTE — Assessment & Plan Note (Signed)
Shortness of breath has resolved with weight loss on diuretics.

## 2011-12-09 NOTE — Patient Instructions (Addendum)
You are doing well. Please increase the lipitor to 40 mg daily  Recheck the cholesterol in three months   Please call us if you have new issues that need to be addressed before your next appt.  Your physician wants you to follow-up in: 6 months.  You will receive a reminder letter in the mail two months in advance. If you don't receive a letter, please call our office to schedule the follow-up appointment.

## 2011-12-09 NOTE — Assessment & Plan Note (Signed)
Blood pressure is elevated in the office today, well controlled at home. We have suggested she continue to monitor her blood pressure at home and call our office for systolic pressures greater than 140.

## 2011-12-09 NOTE — Assessment & Plan Note (Signed)
Currently with no symptoms of angina. No further workup at this time. Continue current medication regimen. 

## 2011-12-09 NOTE — Assessment & Plan Note (Signed)
Edema has resolved. Continue Lasix twice a day.

## 2011-12-09 NOTE — Progress Notes (Signed)
Patient ID: Anna Weaver, female    DOB: 05/31/1933, 78 y.o.   MRN: [id number]  HPI Comments: Anna Weaver is a very pleasant 78 year old woman with  borderline hyperlipidemia, patient of Dr. Sherrie Mustache, with recent symptoms of shortness of breath and edema in Jan 2013, found to be in systolic CHF EF 25%, started on Lasix with a significant weight loss over the past several months,  Stress test showing anterior wall ischemia, with recent cardiac catheterization at [facility] on 05/29/2011 showing occluded proximal LAD with collateral vessels, also 60% OM 2 disease, ejection fraction 40%.  She reports that she has been doing very well on Lasix 20 mg twice a day. Weight has fluctuated between 147 and 150 pounds. He is very strict with her salt and fluid intake. She reports her blood pressure is well controlled at home with systolic pressures in the 130 range. She is nervous today in the office.  Previous echocardiogram January 2013 showed ejection fraction less than 25%. Right ventricular systolic pressures were significantly elevated, estimated at 60 mm of mercury or higher.  Most recent total cholesterol 164, LDL in the 80s  EKG shows normal sinus rhythm withwith nonspecific ST changes in the inferior and lateral leads, poor R wave progression to the anterior precordial leads, consider old anterior MI.   Outpatient Encounter Prescriptions as of 12/09/2011  Medication Sig Dispense Refill  . aspirin 81 MG tablet Take 81 mg by mouth daily.       . furosemide (LASIX) 20 MG tablet Take 1 tablet (20 mg total) by mouth 2 (two) times daily.  180 tablet  3  . lisinopril (PRINIVIL,ZESTRIL) 40 MG tablet Take 1 tablet (40 mg total) by mouth daily.  90 tablet  3  . metoprolol succinate (TOPROL-XL) 50 MG 24 hr tablet Take as directed  160 tablet  3  . Multiple Vitamins-Minerals (MULTIPLE VITAMINS/WOMENS PO) Take by mouth.      . potassium chloride (KLOR-CON 10) 10 MEQ tablet Take 1 tablet (10 mEq total) by mouth 2 (two)  times daily.  120 tablet  3  . atorvastatin (LIPITOR) 20 MG tablet Take 1 tablet (20 mg total) by mouth daily.  90 tablet  4    Review of Systems  Constitutional: Negative.   HENT: Negative.   Eyes: Negative.   Respiratory: Negative.   Cardiovascular: Negative.   Gastrointestinal: Negative.   Musculoskeletal: Negative.   Skin: Negative.   Neurological: Negative.   Hematological: Negative.   Psychiatric/Behavioral: Negative.   All other systems reviewed and are negative.    BP 164/100  Pulse 76  Ht 5\' 4"  (1.626 m)  Wt 149 lb 12 oz (67.926 kg)  BMI 25.70 kg/m2  Physical Exam  Nursing note and vitals reviewed. Constitutional: She is oriented to person, place, and time. She appears well-developed and well-nourished.  HENT:  Head: Normocephalic.  Nose: Nose normal.  Mouth/Throat: Oropharynx is clear and moist.  Eyes: Conjunctivae normal are normal. Pupils are equal, round, and reactive to light.  Neck: Normal range of motion. Neck supple. No JVD present.  Cardiovascular: Normal rate, regular rhythm, S1 normal, S2 normal, normal heart sounds and intact distal pulses.  Exam reveals no gallop and no friction rub.   No murmur heard. Pulmonary/Chest: Effort normal and breath sounds normal. No respiratory distress. She has no wheezes. She has no rales. She exhibits no tenderness.  Abdominal: Soft. Bowel sounds are normal. She exhibits no distension. There is no tenderness.  Musculoskeletal: Normal range of motion.  She exhibits no edema and no tenderness.  Lymphadenopathy:    She has no cervical adenopathy.  Neurological: She is alert and oriented to person, place, and time. Coordination normal.  Skin: Skin is warm and dry. No rash noted. No erythema.  Psychiatric: She has a normal mood and affect. Her behavior is normal. Judgment and thought content normal.         Assessment and Plan

## 2012-03-07 ENCOUNTER — Telehealth: Payer: Self-pay

## 2012-03-07 NOTE — Telephone Encounter (Signed)
lmtcb

## 2012-03-07 NOTE — Telephone Encounter (Signed)
Pt called back i explained she is due for l/l She says she will go to [facility] to have this done thursday

## 2012-03-07 NOTE — Telephone Encounter (Signed)
Ll

## 2012-03-10 ENCOUNTER — Other Ambulatory Visit: Payer: Medicare Other

## 2012-03-11 ENCOUNTER — Other Ambulatory Visit: Payer: Self-pay | Admitting: *Deleted

## 2012-03-11 DIAGNOSIS — I2510 Atherosclerotic heart disease of native coronary artery without angina pectoris: Secondary | ICD-10-CM

## 2012-03-11 DIAGNOSIS — E785 Hyperlipidemia, unspecified: Secondary | ICD-10-CM

## 2012-03-21 ENCOUNTER — Other Ambulatory Visit: Payer: Self-pay

## 2012-03-21 MED ORDER — ROSUVASTATIN CALCIUM 40 MG PO TABS: 40.0000 mg | ORAL_TABLET | Freq: Every day | ORAL | Status: DC

## 2012-04-08 ENCOUNTER — Telehealth: Payer: Self-pay | Admitting: Cardiovascular Disease

## 2012-04-08 NOTE — Telephone Encounter (Signed)
fyi

## 2012-04-08 NOTE — Telephone Encounter (Signed)
Pt states calling to inform Dr. Mariah Milling she is taking herself off of Crestor as of today.  She tried it for 17days and doesn't like the feeling.  It makes her legs feel so heavy that she can't walk on the treadmill anymore.  Please call back to discuss.

## 2012-04-10 NOTE — Telephone Encounter (Signed)
Would go back on lipitor 40 Could try lipruzet 10/40 or add zetia 10 mg to lipitor

## 2012-04-11 NOTE — Telephone Encounter (Signed)
Pt informed She wishes to stay off all chol meds until she sees [place] in April Says she was on lipitor 40 before and tolerated this well but it "did not help her cholesterol" per Dr. Mariah Milling She wants to stay off chol meds until she sees [place] in April/she feels better then will start walking on treadmill

## 2012-04-11 NOTE — Telephone Encounter (Signed)
FYI

## 2012-04-13 NOTE — Telephone Encounter (Signed)
Initially when she started cholesterol medication it went from 210 down to 160 Most recent cholesterol was 180 This could be from weight gain, not taking medication every day, poor eating poorly 180s to high, needs to be less than 150 At least with Lipitor 40 mg daily it will not go back up above 200 Perhaps with good diet, walking, exercise it may go back to 160

## 2012-04-14 NOTE — Telephone Encounter (Signed)
Pt wants to stay off cholesterol meds until she sees [place] in April

## 2012-05-27 ENCOUNTER — Other Ambulatory Visit: Payer: Self-pay | Admitting: Cardiovascular Disease

## 2012-05-27 NOTE — Telephone Encounter (Signed)
Refilled Potassium sent to [facility].

## 2012-06-08 ENCOUNTER — Encounter: Payer: Self-pay | Admitting: Cardiovascular Disease

## 2012-06-08 ENCOUNTER — Ambulatory Visit (INDEPENDENT_AMBULATORY_CARE_PROVIDER_SITE_OTHER): Payer: Medicare Other | Admitting: Cardiovascular Disease

## 2012-06-08 VITALS — BP 138/92 | HR 71 | Ht 64.0 in | Wt 154.8 lb

## 2012-06-08 DIAGNOSIS — I10 Essential (primary) hypertension: Secondary | ICD-10-CM

## 2012-06-08 DIAGNOSIS — I5020 Unspecified systolic (congestive) heart failure: Secondary | ICD-10-CM

## 2012-06-08 DIAGNOSIS — E785 Hyperlipidemia, unspecified: Secondary | ICD-10-CM

## 2012-06-08 DIAGNOSIS — I2510 Atherosclerotic heart disease of native coronary artery without angina pectoris: Secondary | ICD-10-CM

## 2012-06-08 MED ORDER — METOPROLOL SUCCINATE ER 50 MG PO TB24: 50.0000 mg | ORAL_TABLET | Freq: Two times a day (BID) | ORAL | Status: DC

## 2012-06-08 MED ORDER — ATORVASTATIN CALCIUM 40 MG PO TABS: 40.0000 mg | ORAL_TABLET | Freq: Every day | ORAL | Status: DC

## 2012-06-08 NOTE — Progress Notes (Signed)
Patient ID: Anna Weaver, female    DOB: [birth year], 79 y.o.   MRN: [id number]  HPI Comments: Anna Weaver is a very pleasant 79 year-old woman with  borderline hyperlipidemia, patient of Dr. Sherrie Mustache, with recent symptoms of shortness of breath and edema in Jan 2013, found to be in systolic CHF,  EF 25%, started on Lasix with a significant weight loss over 2013,  Stress test showing anterior wall ischemia, with recent cardiac catheterization at [facility] on 05/29/2011 showing occluded proximal LAD with collateral vessels, also 60% OM 2 disease, ejection fraction 40%.  Weight has been steady on her current medication regimen. Overall she feels very well. She was previously changed from Lipitor to Crestor for total cholesterol 180. She did not tolerate Crestor and has stopped the medication. She would like to go back on Lipitor if possible. Otherwise she is active, no complaints of edema, chest pain, shortness of breath. Blood pressure has been stable at home. She is very strict with her fluid intake.  Previous echocardiogram January 2013 showed ejection fraction less than 25%. Right ventricular systolic pressures were significantly elevated, estimated at 60 mm of mercury or higher.  Most recent total cholesterol 164, LDL in the 80s  EKG shows normal sinus rhythm with rate 71 beats per minute with nonspecific ST changes in the inferior and lateral leads, poor R wave progression to the anterior precordial leads, consider old anterior MI.   Outpatient Encounter Prescriptions as of 06/08/2012  Medication Sig Dispense Refill  . aspirin 81 MG tablet Take 81 mg by mouth 2 (two) times daily.       . furosemide (LASIX) 20 MG tablet Take 1 tablet (20 mg total) by mouth 2 (two) times daily.  180 tablet  3  . KLOR-CON 10 10 MEQ tablet take 1 tablet by mouth twice a day  120 tablet  3  . lisinopril (PRINIVIL,ZESTRIL) 40 MG tablet Take 1 tablet (40 mg total) by mouth daily.  90 tablet  3  . metoprolol succinate  (TOPROL-XL) 50 MG 24 hr tablet Take 1 tablet (50 mg total) by mouth 2 (two) times daily. Take 1 tablet in the am with 1 and 1/2 tablet in the pm.  225 tablet  3  . Multiple Vitamins-Minerals (MULTIPLE VITAMINS/WOMENS PO) Take by mouth.      . [DISCONTINUED] rosuvastatin (CRESTOR) 40 MG tablet Take 1 tablet (40 mg total) by mouth daily.  77 tablet  0     Review of Systems  Constitutional: Negative.   HENT: Negative.   Eyes: Negative.   Respiratory: Negative.   Cardiovascular: Negative.   Gastrointestinal: Negative.   Musculoskeletal: Negative.   Skin: Negative.   Neurological: Negative.   Psychiatric/Behavioral: Negative.   All other systems reviewed and are negative.    BP 138/92  Pulse 71  Ht 5\' 4"  (1.626 m)  Wt 154 lb 12 oz (70.194 kg)  BMI 26.55 kg/m2  Physical Exam  Nursing note and vitals reviewed. Constitutional: She is oriented to person, place, and time. She appears well-developed and well-nourished.  HENT:  Head: Normocephalic.  Nose: Nose normal.  Mouth/Throat: Oropharynx is clear and moist.  Eyes: Conjunctivae are normal. Pupils are equal, round, and reactive to light.  Neck: Normal range of motion. Neck supple. No JVD present.  Cardiovascular: Normal rate, regular rhythm, S1 normal, S2 normal, normal heart sounds and intact distal pulses.  Exam reveals no gallop and no friction rub.   No murmur heard. Pulmonary/Chest: Effort normal and breath  sounds normal. No respiratory distress. She has no wheezes. She has no rales. She exhibits no tenderness.  Abdominal: Soft. Bowel sounds are normal. She exhibits no distension. There is no tenderness.  Musculoskeletal: Normal range of motion. She exhibits no edema and no tenderness.  Lymphadenopathy:    She has no cervical adenopathy.  Neurological: She is alert and oriented to person, place, and time. Coordination normal.  Skin: Skin is warm and dry. No rash noted. No erythema.  Psychiatric: She has a normal mood and  affect. Her behavior is normal. Judgment and thought content normal.    Assessment and Plan

## 2012-06-08 NOTE — Assessment & Plan Note (Signed)
Blood pressure is well controlled on today's visit. No changes made to the medications. 

## 2012-06-08 NOTE — Assessment & Plan Note (Signed)
Currently with no symptoms of angina. No further workup at this time. Continue current medication regimen. 

## 2012-06-08 NOTE — Patient Instructions (Addendum)
You are doing well. Hold the crestor Please restart  lipitor one a day  Please call us if you have new issues that need to be addressed before your next appt.  Your physician wants you to follow-up in: 6 months.  You will receive a reminder letter in the mail two months in advance. If you don't receive a letter, please call our office to schedule the follow-up appointment.

## 2012-06-08 NOTE — Assessment & Plan Note (Signed)
She will go back on generic Lipitor 20 mg daily for several weeks before titrating back up to 40 mg daily. In the future we could try zetia 10 mg daily if she is not at goal

## 2012-08-03 ENCOUNTER — Encounter: Payer: Self-pay | Admitting: Cardiovascular Disease

## 2012-09-21 ENCOUNTER — Other Ambulatory Visit: Payer: Self-pay

## 2012-12-08 ENCOUNTER — Encounter: Payer: Self-pay | Admitting: Cardiovascular Disease

## 2012-12-08 ENCOUNTER — Ambulatory Visit (INDEPENDENT_AMBULATORY_CARE_PROVIDER_SITE_OTHER): Payer: Medicare Other | Admitting: Cardiovascular Disease

## 2012-12-08 VITALS — BP 152/108 | HR 76 | Ht 64.0 in | Wt 157.8 lb

## 2012-12-08 DIAGNOSIS — I509 Heart failure, unspecified: Secondary | ICD-10-CM

## 2012-12-08 DIAGNOSIS — I10 Essential (primary) hypertension: Secondary | ICD-10-CM

## 2012-12-08 DIAGNOSIS — I2510 Atherosclerotic heart disease of native coronary artery without angina pectoris: Secondary | ICD-10-CM

## 2012-12-08 DIAGNOSIS — E785 Hyperlipidemia, unspecified: Secondary | ICD-10-CM

## 2012-12-08 DIAGNOSIS — I5020 Unspecified systolic (congestive) heart failure: Secondary | ICD-10-CM

## 2012-12-08 DIAGNOSIS — I5022 Chronic systolic (congestive) heart failure: Secondary | ICD-10-CM | POA: Insufficient documentation

## 2012-12-08 DIAGNOSIS — R000 Tachycardia, unspecified: Secondary | ICD-10-CM

## 2012-12-08 MED ORDER — FUROSEMIDE 20 MG PO TABS: 20.0000 mg | ORAL_TABLET | Freq: Two times a day (BID) | ORAL | Status: DC | PRN

## 2012-12-08 MED ORDER — POTASSIUM CHLORIDE ER 10 MEQ PO TBCR: 10.0000 meq | EXTENDED_RELEASE_TABLET | Freq: Two times a day (BID) | ORAL | Status: DC | PRN

## 2012-12-08 NOTE — Progress Notes (Signed)
Patient ID: Anna Weaver, female    DOB: [birth year], 79 y.o.   MRN: [id number]  HPI Comments: Anna Weaver is a very pleasant 79 year-old woman with  borderline hyperlipidemia, patient of Dr. Sherrie Mustache, with recent symptoms of shortness of breath and edema in Jan 2013, found to be in systolic CHF,  EF 25%, started on Lasix with a significant weight loss over 2013,  Stress test showing anterior wall ischemia, with recent cardiac catheterization at [facility] on 05/29/2011 showing occluded proximal LAD with collateral vessels, also 60% OM 2 disease, ejection fraction 40%.  Weight has been steady on her current medication regimen. Overall she feels very well. In the past she did not tolerate Crestor. She has tolerated Lipitor well . Otherwise she is active, no complaints of edema, chest pain, shortness of breath. Blood pressure has been stable at home. She is very strict with her fluid intake.  Previous echocardiogram January 2013 showed ejection fraction less than 25%. Right ventricular systolic pressures were significantly elevated, estimated at 60 mm of mercury or higher.  EKG shows normal sinus rhythm with rate 76 beats per minute with nonspecific ST changes in the inferior and lateral leads, poor R wave progression to the anterior precordial leads, consider old anterior MI.   Outpatient Encounter Prescriptions 79 y.o.   MRN: [id number]  HPI Comments: Anna Weaver is a very pleasant 79 year-old woman with  Sig Dispense Refill  . aspirin 81 MG tablet Take 81 mg by mouth 2 (two) times daily.       Marland Kitchen atorvastatin (LIPITOR) 40 MG tablet Take 1 tablet (40 mg total) by mouth daily.  90 tablet  3  . furosemide (LASIX) 20 MG tablet Take 20 mg by mouth daily.       Marland Kitchen lisinopril (PRINIVIL,ZESTRIL) 40 MG tablet Take 1 tablet (40 mg total) by mouth daily.  90 tablet  3  . metoprolol succinate (TOPROL-XL) 50 MG 24 hr tablet Take 1 tablet (50 mg total) by mouth 2 (two) times daily. Take 1 tablet in the am with 1 and 1/2 tablet in the pm.  225 tablet  3  . Multiple Vitamins-Minerals  (MULTIPLE VITAMINS/WOMENS PO) Take by mouth.      . potassium chloride (KLOR-CON 10) 10 MEQ tablet take 1 tablet by mouth daily      . [DISCONTINUED] furosemide (LASIX) 20 MG tablet Take 1 tablet (20 mg total) by mouth 2 (two) times daily.  180 tablet  3  . [DISCONTINUED] KLOR-CON 10 10 MEQ tablet take 1 tablet by mouth twice a day  120 tablet  3   No facility-administered encounter medications on file as of 12/08/2012.     Review of Systems  Constitutional: Negative.   HENT: Negative.   Eyes: Negative.   Respiratory: Negative.   Cardiovascular: Negative.   Gastrointestinal: Negative.   Endocrine: Negative.   Musculoskeletal: Negative.   Skin: Negative.   Allergic/Immunologic: Negative.   Neurological: Negative.   Hematological: Negative.   Psychiatric/Behavioral: Negative.   All other systems reviewed and are negative.    BP 152/108  Pulse 76  Ht 5\' 4"  (1.626 m)  Wt 157 lb 12 oz (71.555 kg)  BMI 27.06 kg/m2  Physical Exam  Nursing note and vitals reviewed. Constitutional: She is oriented to person, place, and time. She appears well-developed and well-nourished.  HENT:  Head: Normocephalic.  Nose: Nose normal.  Mouth/Throat: Oropharynx is clear and moist.  Eyes: Conjunctivae are normal. Pupils are equal, round, and reactive to light.  Neck: Normal range of motion. Neck supple. No JVD present.  Cardiovascular: Normal  rate, regular rhythm, S1 normal, S2 normal, normal heart sounds and intact distal pulses.  Exam reveals no gallop and no friction rub.   No murmur heard. Pulmonary/Chest: Effort normal and breath sounds normal. No respiratory distress. She has no wheezes. She has no rales. She exhibits no tenderness.  Abdominal: Soft. Bowel sounds are normal. She exhibits no distension. There is no tenderness.  Musculoskeletal: Normal range of motion. She exhibits no edema and no tenderness.  Lymphadenopathy:    She has no cervical adenopathy.  Neurological: She is alert  and oriented to person, place, and time. Coordination normal.  Skin: Skin is warm and dry. No rash noted. No erythema.  Psychiatric: She has a normal mood and affect. Her behavior is normal. Judgment and thought content normal.    Assessment and Plan

## 2012-12-08 NOTE — Assessment & Plan Note (Signed)
Currently with no symptoms of angina. No further workup at this time. Continue current medication regimen. 

## 2012-12-08 NOTE — Assessment & Plan Note (Deleted)
She has done very well with her weight. No signs of CHF exacerbation. She is taking Lasix daily. 

## 2012-12-08 NOTE — Patient Instructions (Addendum)
You are doing well. No medication changes were made.  Please monitor the blood pressure at home  Call the office if it runs high  Please call us if you have new issues that need to be addressed before your next appt.  Your physician wants you to follow-up in: 12 months.  You will receive a reminder letter in the mail two months in advance. If you don't receive a letter, please call our office to schedule the follow-up appointment.

## 2012-12-08 NOTE — Assessment & Plan Note (Signed)
Will wait for repeat cholesterol level. Ideally goal total cholesterol less than 150

## 2012-12-08 NOTE — Assessment & Plan Note (Signed)
Blood pressure is well controlled on today's visit. No changes made to the medications. 

## 2012-12-08 NOTE — Assessment & Plan Note (Signed)
She has done very well with her weight. No signs of CHF exacerbation. She is taking Lasix daily.

## 2012-12-08 NOTE — Assessment & Plan Note (Signed)
Heart rate well controlled on current dose of beta blocker. No changes made

## 2012-12-22 ENCOUNTER — Other Ambulatory Visit: Payer: Self-pay

## 2013-02-03 ENCOUNTER — Other Ambulatory Visit: Payer: Self-pay | Admitting: Cardiovascular Disease

## 2013-02-03 NOTE — Telephone Encounter (Signed)
Requested Prescriptions   Signed Prescriptions Disp Refills  . lisinopril (PRINIVIL,ZESTRIL) 40 MG tablet 90 tablet 3    Sig: take 1 tablet by mouth once daily    Authorizing Provider: Antonieta Iba    Ordering User: Kendrick Fries

## 2013-06-07 ENCOUNTER — Other Ambulatory Visit: Payer: Self-pay | Admitting: Cardiovascular Disease

## 2013-08-24 ENCOUNTER — Telehealth: Payer: Self-pay

## 2013-08-24 NOTE — Telephone Encounter (Signed)
Would suggest she closely monitor her pressure, Write down numbers, appt tomorrow at 10:15, does she want to come in.  She could start amlodipine 5 mg daily, If no improvement, would increase to 10 mg in a few days. Would probably be best to talk with her to pick the right medication.

## 2013-08-24 NOTE — Telephone Encounter (Signed)
Patient prefers to speak with Dr. Mariah MillingGollan  Scheduled her to see him tomorrow

## 2013-08-24 NOTE — Telephone Encounter (Signed)
States pt was 192/128 yesterday, today was 196/128. HR is 72 Please call.

## 2013-08-24 NOTE — Telephone Encounter (Signed)
Patient called and stated her blood pressure has been trending up recently   Her blood pressure was 192/128 yesterday  Today it is 169/128 She states that she feels fine and that her baseline is usually mildly elevated  She tested her blood pressure cuff to make sure it was accurate and it was  She has taken all of her medications this morning

## 2013-08-25 ENCOUNTER — Encounter: Payer: Self-pay | Admitting: Cardiovascular Disease

## 2013-08-25 ENCOUNTER — Ambulatory Visit (INDEPENDENT_AMBULATORY_CARE_PROVIDER_SITE_OTHER): Payer: Medicare Other | Admitting: Cardiovascular Disease

## 2013-08-25 VITALS — BP 178/102 | HR 75 | Ht 64.0 in | Wt 157.0 lb

## 2013-08-25 DIAGNOSIS — E785 Hyperlipidemia, unspecified: Secondary | ICD-10-CM

## 2013-08-25 DIAGNOSIS — I2510 Atherosclerotic heart disease of native coronary artery without angina pectoris: Secondary | ICD-10-CM

## 2013-08-25 DIAGNOSIS — I10 Essential (primary) hypertension: Secondary | ICD-10-CM

## 2013-08-25 DIAGNOSIS — R000 Tachycardia, unspecified: Secondary | ICD-10-CM

## 2013-08-25 DIAGNOSIS — I5022 Chronic systolic (congestive) heart failure: Secondary | ICD-10-CM

## 2013-08-25 MED ORDER — CLONIDINE HCL 0.1 MG PO TABS: 0.1000 mg | ORAL_TABLET | Freq: Two times a day (BID) | ORAL | Status: DC

## 2013-08-25 MED ORDER — HYDRALAZINE HCL 25 MG PO TABS: 25.0000 mg | ORAL_TABLET | Freq: Three times a day (TID) | ORAL | Status: DC | PRN

## 2013-08-25 NOTE — Assessment & Plan Note (Signed)
She appears relatively euvolemic. Weight stable. No shortness of breath. No medication changes apart from blood pressure meds as above

## 2013-08-25 NOTE — Progress Notes (Signed)
Patient ID: Anna Weaver, female    DOB: 09/08/1933, 80 y.o.   MRN: [id number]  HPI Comments: Anna Weaver is a very pleasant 80 year-old woman with  borderline hyperlipidemia, patient of Dr. Sherrie Mustache, with recent symptoms of shortness of breath and edema in Jan 2013, found to be in systolic CHF,  EF 25%, started on Lasix with a significant weight loss over 2013,  Stress test showing anterior wall ischemia, with recent cardiac catheterization at [facility] on 05/29/2011 showing occluded proximal LAD with collateral vessels, also 60% OM 2 disease, ejection fraction 40%.  In followup today, she reports that her blood pressure has been very elevated over the past several months. She did not call us until recently. Systolics usually 170-180, rare pressure of 200. Typically diastolic more than 100. Some periods of anxiety. Anna Weaver gave her Valium with significant improvement. He no longer has any medication to give her. Otherwise feeling well, using a treadmill 30 minutes per day with no symptoms of shortness of breath or chest pain. Weight has been stable. Denies having any lower extremity edema.  tolerated Lipitor well .  Previous echocardiogram January 2013 showed ejection fraction less than 25%. Right ventricular systolic pressures were significantly elevated, estimated at 60 mm of mercury or higher.  EKG shows normal sinus rhythm with rate 75 beats per minute with no significant ST or T wave changes   Outpatient Encounter Prescriptions as of 08/25/2013  Medication Sig  . aspirin 81 MG tablet Take 81 mg by mouth 2 (two) times daily.   Marland Kitchen atorvastatin (LIPITOR) 40 MG tablet take 1 tablet by mouth once daily  . furosemide (LASIX) 20 MG tablet Take 1 tablet (20 mg total) by mouth 2 (two) times daily as needed.  Marland Kitchen lisinopril (PRINIVIL,ZESTRIL) 40 MG tablet take 1 tablet by mouth once daily  . metoprolol succinate (TOPROL-XL) 50 MG 24 hr tablet Take 1 tablet (50 mg total) by mouth 2 (two) times daily. Take 1  tablet in the am with 1 and 1/2 tablet in the pm.  . Multiple Vitamins-Minerals (MULTIPLE VITAMINS/WOMENS PO) Take by mouth.  . potassium chloride (KLOR-CON 10) 10 MEQ tablet Take 1 tablet (10 mEq total) by mouth 2 (two) times daily as needed.     Review of Systems  Constitutional: Negative.   HENT: Negative.   Eyes: Negative.   Respiratory: Negative.   Cardiovascular: Negative.   Gastrointestinal: Negative.   Endocrine: Negative.   Musculoskeletal: Negative.   Skin: Negative.   Allergic/Immunologic: Negative.   Neurological: Negative.   Hematological: Negative.   Psychiatric/Behavioral: Negative.   All other systems reviewed and are negative.   BP 178/102  Pulse 75  Ht 5\' 4"  (1.626 m)  Wt 157 lb (71.215 kg)  BMI 26.94 kg/m2  Physical Exam  Nursing note and vitals reviewed. Constitutional: She is oriented to person, place, and time. She appears well-developed and well-nourished.  HENT:  Head: Normocephalic.  Nose: Nose normal.  Mouth/Throat: Oropharynx is clear and moist.  Eyes: Conjunctivae are normal. Pupils are equal, round, and reactive to light.  Neck: Normal range of motion. Neck supple. No JVD present.  Cardiovascular: Normal rate, regular rhythm, S1 normal, S2 normal, normal heart sounds and intact distal pulses.  Exam reveals no gallop and no friction rub.   No murmur heard. Pulmonary/Chest: Effort normal and breath sounds normal. No respiratory distress. She has no wheezes. She has no rales. She exhibits no tenderness.  Abdominal: Soft. Bowel sounds are normal. She exhibits no distension.  There is no tenderness.  Musculoskeletal: Normal range of motion. She exhibits no edema and no tenderness.  Lymphadenopathy:    She has no cervical adenopathy.  Neurological: She is alert and oriented to person, place, and time. Coordination normal.  Skin: Skin is warm and dry. No rash noted. No erythema.  Psychiatric: She has a normal mood and affect. Her behavior is normal.  Judgment and thought content normal.    Assessment and Plan

## 2013-08-25 NOTE — Assessment & Plan Note (Signed)
We'll try to obtain the most recent lipid panel from primary care. Goal LDL less than 70

## 2013-08-25 NOTE — Assessment & Plan Note (Signed)
Currently with no symptoms of angina. No further workup at this time. Continue current medication regimen. 

## 2013-08-25 NOTE — Assessment & Plan Note (Addendum)
Blood pressure continues to run consistently high. We will start clonidine 0.1 mg twice a day. We have warned her about the side effects of dry mouth. We'll avoid amlodipine for now given nonpitting leg edema that is mild. Could try this at a later date if needed. We have given her hydralazine 25 mg to take as needed for emergencies, systolic pressure more than 180. If blood pressure continues to run high despite adding additional medications, we would order a renal artery ultrasound

## 2013-08-25 NOTE — Assessment & Plan Note (Signed)
Tachycardia as on previous visits has improved with metoprolol. We'll not change the dose of her metoprolol

## 2013-08-25 NOTE — Patient Instructions (Addendum)
Please start clonidine one pill twice a day  For blood pressure higher than 180, Take a hydralazine 1 or 2 pills (lasts for 6 hours)  Please call us if you have new issues that need to be addressed before your next appt.  Your physician wants you to follow-up in: 1 month.

## 2013-08-30 ENCOUNTER — Other Ambulatory Visit: Payer: Self-pay | Admitting: Cardiovascular Disease

## 2013-09-29 ENCOUNTER — Ambulatory Visit: Payer: Medicare Other | Admitting: Cardiovascular Disease

## 2013-10-16 ENCOUNTER — Telehealth: Payer: Self-pay

## 2013-10-16 NOTE — Telephone Encounter (Signed)
Spoke w/ pt.  She reports that her BP has been increasing for the past 3 days.  She states that she was given hydralazine to take prn and was advised by Dr. Mariah Milling that she may need to increase her clonidine to 0.2mg  daily.  Pt reports her BP today 186/118. Advised pt to take another hydralazine, monitor her BP and if diastolic is still >100 this evening, to take 2 clonidine 0.1mg  tabs.  She is appreciative and will call later this week to let me know how her numbers look.

## 2013-10-16 NOTE — Telephone Encounter (Signed)
Pt states she had to take an "emergency pill" (hydralazine) and needs to know what to do next. BP was 186.

## 2013-10-17 MED ORDER — CLONIDINE HCL 0.2 MG PO TABS: 0.2000 mg | ORAL_TABLET | Freq: Two times a day (BID) | ORAL | Status: DC

## 2013-10-17 NOTE — Telephone Encounter (Signed)
Increase clonidine to 0.2mg  BID. She may continue to take hydralazine  tid prn for systolics > .  If however, she is taking hydralazine on a regular basis, despite titrating clonidine, she should inform us b/c our next step would be to increase clonidine to 0.3mg  bid and after that just add scheduled dosing of hydralazine (as opposed to prn).

## 2013-10-17 NOTE — Telephone Encounter (Signed)
Spoke w/ pt.  She states that the hydralazine is making her BP go up. Reports BP yesterday: 3:30  183/112 6:30  156/125 10:00 163/104 Took 3rd hydralazine last night at 12:15am. BP on waking this am 190/116. After taking hydralazine, went up to 213/120. Please advise what changes she should make.  Thank you.

## 2013-10-17 NOTE — Telephone Encounter (Signed)
Spoke w/ pt.  Advised her of Chris's recommendation.  She verbalizes understanding and is agreeable to increasing clonidine, but does not want to take any more hydralazine.  She states that she would like to try the increased clonidine, monitor her BP and call if numbers do not decrease.

## 2013-10-19 NOTE — Telephone Encounter (Signed)
Pt left message on voicemail stating that since increasing her clonidine, her BP yesterday was 127/83. Pt reports that is the lowest reading she has had in some time and is greatly appreciative.  Pt states that she will call back if anything changes.

## 2013-12-07 ENCOUNTER — Encounter: Payer: Self-pay | Admitting: Cardiovascular Disease

## 2013-12-07 ENCOUNTER — Ambulatory Visit (INDEPENDENT_AMBULATORY_CARE_PROVIDER_SITE_OTHER): Payer: Medicare Other | Admitting: Cardiovascular Disease

## 2013-12-07 VITALS — BP 164/100 | HR 76 | Ht 64.0 in | Wt 158.0 lb

## 2013-12-07 DIAGNOSIS — I159 Secondary hypertension, unspecified: Secondary | ICD-10-CM

## 2013-12-07 DIAGNOSIS — R000 Tachycardia, unspecified: Secondary | ICD-10-CM

## 2013-12-07 DIAGNOSIS — F419 Anxiety disorder, unspecified: Secondary | ICD-10-CM | POA: Insufficient documentation

## 2013-12-07 DIAGNOSIS — E785 Hyperlipidemia, unspecified: Secondary | ICD-10-CM

## 2013-12-07 DIAGNOSIS — I2510 Atherosclerotic heart disease of native coronary artery without angina pectoris: Secondary | ICD-10-CM

## 2013-12-07 DIAGNOSIS — I5022 Chronic systolic (congestive) heart failure: Secondary | ICD-10-CM

## 2013-12-07 MED ORDER — AMLODIPINE BESYLATE 10 MG PO TABS: 10.0000 mg | ORAL_TABLET | Freq: Every day | ORAL | Status: DC

## 2013-12-07 MED ORDER — CLONIDINE HCL 0.1 MG PO TABS: 0.1000 mg | ORAL_TABLET | Freq: Two times a day (BID) | ORAL | Status: DC

## 2013-12-07 NOTE — Assessment & Plan Note (Signed)
Long history of anxiety. This is affecting her blood pressure. She may be a candidate for Xanax or other options such as Paxil. Suggested she talk with Dr. Sherrie MustacheFisher

## 2013-12-07 NOTE — Assessment & Plan Note (Signed)
Very dry mouth on clonidine 0.2 mg twice a day. We have recommended that she decrease the clonidine down to 0.1 mg twice a day and start amlodipine 10 mg daily, monitor her blood pressure daily for 2 weeks

## 2013-12-07 NOTE — Assessment & Plan Note (Signed)
Currently with no symptoms of angina. No further workup at this time. Continue current medication regimen. 

## 2013-12-07 NOTE — Assessment & Plan Note (Signed)
Recommended that we need to obtain her most recent lipid panel. Goal LDL less than 70

## 2013-12-07 NOTE — Assessment & Plan Note (Signed)
She appears euvolemic on today's visit. Suggested she continue to take Lasix as needed

## 2013-12-07 NOTE — Progress Notes (Signed)
Patient ID: Daryel GeraldLinda Weaver, female    DOB: March 31, 1933, 80 y.o.   MRN: [id number]  HPI Comments: [name] is a very pleasant 80 year-old woman with  borderline hyperlipidemia, patient of Dr. Sherrie MustacheFisher, with recent symptoms of shortness of breath and edema in Jan 2013, found to be in systolic CHF,  EF 25%, started on Lasix with a significant weight loss over 2013,  Stress test showing anterior wall ischemia, with recent cardiac catheterization at [facility] on 05/29/2011 showing occluded proximal LAD with collateral vessels, also 60% OM 2 disease, ejection fraction 40%.  In followup today, she reports that she has been very anxious recently. She has not been doing any regular exercise. Blood pressure was running very good through August and September, has been elevated in October Sometimes does her treadmill  no symptoms of shortness of breath or chest pain. Weight has been stable. Denies having any lower extremity edema.  tolerated Lipitor well .  Previous echocardiogram January 2013 showed ejection fraction less than 25%. Right ventricular systolic pressures were significantly elevated, estimated at 60 mm of mercury or higher.  EKG shows normal sinus rhythm with rate 76 beats per minute with no significant ST or T wave changes   Outpatient Encounter Prescriptions as of 12/07/2013  Medication Sig  . aspirin 81 MG tablet Take 81 mg by mouth 2 (two) times daily.   Marland Kitchen. atorvastatin (LIPITOR) 40 MG tablet take 1 tablet by mouth once daily  . cloNIDine (CATAPRES) 0.2 MG tablet Take 1 tablet (0.2 mg total) by mouth 2 (two) times daily.  . furosemide (LASIX) 20 MG tablet Take 1 tablet (20 mg total) by mouth 2 (two) times daily as needed.  Marland Kitchen. lisinopril (PRINIVIL,ZESTRIL) 40 MG tablet take 1 tablet by mouth once daily  . metoprolol succinate (TOPROL-XL) 50 MG 24 hr tablet TAKE 1 TABLET IN THE MORNING AND 1 AND 1/2 TABLETS IN THE EVENING AS DIRECTED  . Multiple Vitamins-Minerals (MULTIPLE VITAMINS/WOMENS PO) Take  by mouth.  . potassium chloride (KLOR-CON 10) 10 MEQ tablet Take 1 tablet (10 mEq total) by mouth 2 (two) times daily as needed.  . hydrALAZINE (APRESOLINE) 25 MG tablet Take 1 tablet (25 mg total) by mouth 3 (three) times daily as needed.     Review of Systems  Constitutional: Negative.   HENT: Negative.   Eyes: Negative.   Respiratory: Negative.   Cardiovascular: Negative.   Gastrointestinal: Negative.   Endocrine: Negative.   Musculoskeletal: Negative.   Skin: Negative.   Allergic/Immunologic: Negative.   Neurological: Negative.   Hematological: Negative.   Psychiatric/Behavioral: Negative.   All other systems reviewed and are negative.   BP 164/100  Pulse 76  Ht 5\' 4"  (1.626 m)  Wt 158 lb (71.668 kg)  BMI 27.11 kg/m2  Physical Exam  Nursing note and vitals reviewed. Constitutional: She is oriented to person, place, and time. She appears well-developed and well-nourished.  HENT:  Head: Normocephalic.  Nose: Nose normal.  Mouth/Throat: Oropharynx is clear and moist.  Eyes: Conjunctivae are normal. Pupils are equal, round, and reactive to light.  Neck: Normal range of motion. Neck supple. No JVD present.  Cardiovascular: Normal rate, regular rhythm, S1 normal, S2 normal, normal heart sounds and intact distal pulses.  Exam reveals no gallop and no friction rub.   No murmur heard. Pulmonary/Chest: Effort normal and breath sounds normal. No respiratory distress. She has no wheezes. She has no rales. She exhibits no tenderness.  Abdominal: Soft. Bowel sounds are normal. She exhibits  no distension. There is no tenderness.  Musculoskeletal: Normal range of motion. She exhibits no edema and no tenderness.  Lymphadenopathy:    She has no cervical adenopathy.  Neurological: She is alert and oriented to person, place, and time. Coordination normal.  Skin: Skin is warm and dry. No rash noted. No erythema.  Psychiatric: She has a normal mood and affect. Her behavior is normal.  Judgment and thought content normal.    Assessment and Plan

## 2013-12-07 NOTE — Assessment & Plan Note (Signed)
Heart rate well controlled on her current medications

## 2013-12-07 NOTE — Patient Instructions (Addendum)
Your next appointment will be scheduled in our new office located at :  [facility]  [address], [address]  [place], [place] [id number]  Please decrease the clonidine down to 0.1 mg twice a day Start amlodipine one a day, take the lisinopril at the opposite time of the day  Please check blood pressure once a day for two weeks   For emergency pill, take an extra clonidine 0.1 mg as needed  Please call us if you have new issues that need to be addressed before your next appt.  Your physician wants you to follow-up in: 3 months.  You will receive a reminder letter in the mail two months in advance. If you don't receive a letter, please call our office to schedule the follow-up appointment.

## 2014-01-31 ENCOUNTER — Other Ambulatory Visit: Payer: Self-pay | Admitting: Cardiovascular Disease

## 2014-03-01 ENCOUNTER — Other Ambulatory Visit: Payer: Self-pay | Admitting: Cardiovascular Disease

## 2014-03-02 ENCOUNTER — Other Ambulatory Visit: Payer: Self-pay | Admitting: *Deleted

## 2014-03-02 MED ORDER — FUROSEMIDE 20 MG PO TABS: 20.0000 mg | ORAL_TABLET | Freq: Two times a day (BID) | ORAL | Status: DC | PRN

## 2014-03-02 MED ORDER — POTASSIUM CHLORIDE ER 10 MEQ PO TBCR: 10.0000 meq | EXTENDED_RELEASE_TABLET | Freq: Two times a day (BID) | ORAL | Status: DC | PRN

## 2014-03-12 ENCOUNTER — Ambulatory Visit: Payer: Medicare Other | Admitting: Cardiovascular Disease

## 2014-03-28 ENCOUNTER — Encounter: Payer: Self-pay | Admitting: Cardiovascular Disease

## 2014-03-28 ENCOUNTER — Ambulatory Visit (INDEPENDENT_AMBULATORY_CARE_PROVIDER_SITE_OTHER): Payer: Medicare Other | Admitting: Cardiovascular Disease

## 2014-03-28 VITALS — BP 130/70 | HR 76 | Ht 64.0 in | Wt 160.0 lb

## 2014-03-28 DIAGNOSIS — I5022 Chronic systolic (congestive) heart failure: Secondary | ICD-10-CM | POA: Diagnosis not present

## 2014-03-28 DIAGNOSIS — I159 Secondary hypertension, unspecified: Secondary | ICD-10-CM

## 2014-03-28 DIAGNOSIS — E785 Hyperlipidemia, unspecified: Secondary | ICD-10-CM | POA: Diagnosis not present

## 2014-03-28 DIAGNOSIS — I2510 Atherosclerotic heart disease of native coronary artery without angina pectoris: Secondary | ICD-10-CM | POA: Diagnosis not present

## 2014-03-28 DIAGNOSIS — F419 Anxiety disorder, unspecified: Secondary | ICD-10-CM

## 2014-03-28 NOTE — Progress Notes (Signed)
Patient ID: Anna Weaver, female    DOB: [birth year], 81 y.o.   MRN: [id number]  HPI Comments: Anna Weaver is a very pleasant 82 year-old woman with hyperlipidemia, patient of Dr. Sherrie Mustache, with shortness of breath and edema in Jan 2013, found to be in systolic CHF,  EF 25%, started on Lasix with a significant weight loss over 2013,  Stress test showing anterior wall ischemia, with recent cardiac catheterization at [facility] on 05/29/2011 showing occluded proximal LAD with collateral vessels, also 60% OM 2 disease, ejection fraction 40%. She presents for follow-up of her coronary artery disease  In general she has been feeling very well. Denies having any shortness of breath or chest pain. Systolic blood pressure running 120 up to 130 systolic No significant side effects from her medications. She currently takes lisinopril, metoprolol succinate, clonidine and amlodipine She does not take any hydralazine tolerating her statin. No regular exercise program though tries to stay active Weight has been stable. Denies having any lower extremity edema.  EKG on today's visit shows normal sinus rhythm with rate 76 bpm, no significant ST or T-wave changes   Previous echocardiogram January 2013 showed ejection fraction less than 25%. Right ventricular systolic pressures were significantly elevated, estimated at 60 mm of mercury or higher.    No Known Allergies  Outpatient Encounter Prescriptions as of 03/28/2014  Medication Sig  . amLODipine (NORVASC) 10 MG tablet Take 1 tablet (10 mg total) by mouth daily.  Marland Kitchen aspirin 81 MG tablet Take 81 mg by mouth 2 (two) times daily.   Marland Kitchen atorvastatin (LIPITOR) 40 MG tablet take 1 tablet by mouth once daily  . cloNIDine (CATAPRES) 0.1 MG tablet Take 1 tablet (0.1 mg total) by mouth 2 (two) times daily.  . furosemide (LASIX) 20 MG tablet take 1 tablet by mouth twice a day if needed  . furosemide (LASIX) 20 MG tablet Take 1 tablet (20 mg total) by mouth 2 (two) times daily as  needed.  Marland Kitchen lisinopril (PRINIVIL,ZESTRIL) 40 MG tablet take 1 tablet by mouth once daily  . metoprolol succinate (TOPROL-XL) 50 MG 24 hr tablet TAKE 1 TABLET IN THE MORNING AND 1 AND 1/2 TABLETS IN THE EVENING AS DIRECTED  . Multiple Vitamins-Minerals (MULTIPLE VITAMINS/WOMENS PO) Take by mouth.  . potassium chloride (K-DUR) 10 MEQ tablet take 1 tablet by mouth twice a day  . potassium chloride (KLOR-CON 10) 10 MEQ tablet Take 1 tablet (10 mEq total) by mouth 2 (two) times daily as needed.  . [DISCONTINUED] hydrALAZINE (APRESOLINE) 25 MG tablet Take 1 tablet (25 mg total) by mouth 3 (three) times daily as needed. (Patient not taking: Reported on 03/28/2014)    Past Medical History  Diagnosis Date  . Hypertension   . Hx of measles     childhood  . CHF (congestive heart failure) 03/04/2011  . Edema 03/04/2011  . SOB (shortness of breath) 03/04/2011  . Tachycardia 03/04/2011    Past Surgical History  Procedure Laterality Date  . Tonsillectomy  1943  . Cardiac catheterization  05/29/11    Social History  reports that she has never smoked. She has never used smokeless tobacco. She reports that she does not drink alcohol or use illicit drugs.  Family History family history includes Heart disease in her father; Hypertension in her brother, sister, and sister; Other in her mother.   Review of Systems  Constitutional: Negative.   Respiratory: Negative.   Cardiovascular: Negative.   Gastrointestinal: Negative.   Musculoskeletal: Negative.  Skin: Negative.   Neurological: Negative.   Hematological: Negative.   Psychiatric/Behavioral: Negative.   All other systems reviewed and are negative.   BP 130/70 mmHg  Pulse 76  Ht 5\' 4"  (1.626 m)  Wt 160 lb (72.576 kg)  BMI 27.45 kg/m2  Physical Exam  Constitutional: She is oriented to person, place, and time. She appears well-developed and well-nourished.  HENT:  Head: Normocephalic.  Nose: Nose normal.  Mouth/Throat: Oropharynx is  clear and moist.  Eyes: Conjunctivae are normal. Pupils are equal, round, and reactive to light.  Neck: Normal range of motion. Neck supple. No JVD present.  Cardiovascular: Normal rate, regular rhythm, S1 normal, S2 normal, normal heart sounds and intact distal pulses.  Exam reveals no gallop and no friction rub.   No murmur heard. Pulmonary/Chest: Effort normal and breath sounds normal. No respiratory distress. She has no wheezes. She has no rales. She exhibits no tenderness.  Abdominal: Soft. Bowel sounds are normal. She exhibits no distension. There is no tenderness.  Musculoskeletal: Normal range of motion. She exhibits no edema or tenderness.  Lymphadenopathy:    She has no cervical adenopathy.  Neurological: She is alert and oriented to person, place, and time. Coordination normal.  Skin: Skin is warm and dry. No rash noted. No erythema.  Psychiatric: She has a normal mood and affect. Her behavior is normal. Judgment and thought content normal.    Assessment and Plan  Nursing note and vitals reviewed.

## 2014-03-28 NOTE — Assessment & Plan Note (Signed)
Seems to be significantly less anxious on today's visit.

## 2014-03-28 NOTE — Assessment & Plan Note (Signed)
Blood pressure is well controlled on today's visit. No changes made to the medications. 

## 2014-03-28 NOTE — Assessment & Plan Note (Signed)
She appears euvolemic on today's visit. Suggested she continue to take Lasix daily. She was taking Lasix twice a day through Christmas secondary to dietary indiscretion. Recommend she go back to one a day

## 2014-03-28 NOTE — Assessment & Plan Note (Signed)
We will order lipid panel today. Detailed orders provided with her to take to [facility]

## 2014-03-28 NOTE — Assessment & Plan Note (Signed)
Currently with no symptoms of angina. No further workup at this time. Continue current medication regimen. 

## 2014-03-28 NOTE — Patient Instructions (Signed)
You are doing well. No medication changes were made.  We will check labs today, fasting  Please call us if you have new issues that need to be addressed before your next appt.  Your physician wants you to follow-up in: 6 months.  You will receive a reminder letter in the mail two months in advance. If you don't receive a letter, please call our office to schedule the follow-up appointment.

## 2014-04-05 ENCOUNTER — Encounter: Payer: Self-pay | Admitting: Cardiovascular Disease

## 2014-04-18 ENCOUNTER — Other Ambulatory Visit: Payer: Self-pay | Admitting: Cardiovascular Disease

## 2014-04-18 MED ORDER — ATORVASTATIN CALCIUM 80 MG PO TABS: 80.0000 mg | ORAL_TABLET | Freq: Every day | ORAL | Status: DC

## 2014-06-01 ENCOUNTER — Other Ambulatory Visit: Payer: Self-pay | Admitting: Cardiovascular Disease

## 2014-06-25 ENCOUNTER — Encounter: Payer: Self-pay | Admitting: Cardiovascular Disease

## 2014-07-02 NOTE — Telephone Encounter (Signed)
Pt called stating since Dr Mariah MillingGollan did not answer her email she went on and started on taking 40 MG of Atorvastatin.  She states she is doing a lot better, would like us to just send in a refill on this to [facility] on [address].  I did mention to patient for she felt she was being "ignored" that when we get the messages from MyChart they are then looked at and sorted by [employer], and that last week we were really busy in the ED  She understood and was fine after I told her that. She would just like us to send in a refill so she can pick it up later in June. Also would like us to call her once this is done or if he thinks something else should be tried.  Please advise.

## 2014-07-04 ENCOUNTER — Encounter: Payer: Self-pay | Admitting: Cardiovascular Disease

## 2014-07-12 ENCOUNTER — Other Ambulatory Visit: Payer: Self-pay | Admitting: Cardiovascular Disease

## 2014-07-12 MED ORDER — EZETIMIBE 10 MG PO TABS: 10.0000 mg | ORAL_TABLET | Freq: Every day | ORAL | Status: DC

## 2014-07-13 ENCOUNTER — Other Ambulatory Visit: Payer: Self-pay | Admitting: Cardiovascular Disease

## 2014-08-30 ENCOUNTER — Other Ambulatory Visit: Payer: Self-pay | Admitting: Cardiovascular Disease

## 2014-10-12 ENCOUNTER — Encounter: Payer: Self-pay | Admitting: Cardiovascular Disease

## 2014-10-12 ENCOUNTER — Ambulatory Visit (INDEPENDENT_AMBULATORY_CARE_PROVIDER_SITE_OTHER): Payer: Medicare Other | Admitting: Cardiovascular Disease

## 2014-10-12 VITALS — BP 130/80 | HR 78 | Ht 64.0 in | Wt 158.5 lb

## 2014-10-12 DIAGNOSIS — I159 Secondary hypertension, unspecified: Secondary | ICD-10-CM

## 2014-10-12 DIAGNOSIS — R000 Tachycardia, unspecified: Secondary | ICD-10-CM

## 2014-10-12 DIAGNOSIS — I2510 Atherosclerotic heart disease of native coronary artery without angina pectoris: Secondary | ICD-10-CM

## 2014-10-12 DIAGNOSIS — F419 Anxiety disorder, unspecified: Secondary | ICD-10-CM

## 2014-10-12 DIAGNOSIS — I5022 Chronic systolic (congestive) heart failure: Secondary | ICD-10-CM | POA: Diagnosis not present

## 2014-10-12 DIAGNOSIS — E785 Hyperlipidemia, unspecified: Secondary | ICD-10-CM

## 2014-10-12 NOTE — Assessment & Plan Note (Signed)
Blood pressure is well controlled on today's visit. No changes made to the medications. 

## 2014-10-12 NOTE — Progress Notes (Signed)
Patient ID: Anna Weaver, female    DOB: 03/14/1933, 81 y.o.   MRN: [id number]  HPI Comments:  Anna Weaver is a very pleasant 81 year-old woman with hyperlipidemia, patient of Dr. Sherrie Mustache, with shortness of breath and edema in Jan 2013, found to be in systolic CHF,  EF 25%, started on Lasix with a significant weight loss over 2013,  Stress test showing anterior wall ischemia, with recent cardiac catheterization at [facility] on 05/29/2011 showing occluded proximal LAD with collateral vessels, also 60% OM 2 disease, ejection fraction 40%. She presents for follow-up of her coronary artery disease and systolic dysfunction  In follow-up today, she reports that she feels well. Blood pressure has been well-controlled with systolic pressure 110 up to 140 She is active, doing more activities with no symptoms of angina. Tolerating her medications without side effects She takes Lasix when necessary  EKG on today's visit shows normal sinus rhythm with rate 78 bpm, T-wave abnormality  Previous echocardiogram January 2013 showed ejection fraction less than 25%. Right ventricular systolic pressures were significantly elevated, estimated at 60 mm of mercury or higher.    No Known Allergies  Current Outpatient Prescriptions on File Prior to Visit  Medication Sig Dispense Refill  . amLODipine (NORVASC) 10 MG tablet Take 1 tablet (10 mg total) by mouth daily. 90 tablet 4  . aspirin 81 MG tablet Take 81 mg by mouth 2 (two) times daily.     . cloNIDine (CATAPRES) 0.1 MG tablet Take 1 tablet (0.1 mg total) by mouth 2 (two) times daily. 180 tablet 4  . ezetimibe (ZETIA) 10 MG tablet Take 1 tablet (10 mg total) by mouth daily. 30 tablet 11  . furosemide (LASIX) 20 MG tablet take 1 tablet by mouth twice a day if needed 180 tablet 3  . lisinopril (PRINIVIL,ZESTRIL) 40 MG tablet take 1 tablet by mouth once daily 90 tablet 3  . metoprolol succinate (TOPROL-XL) 50 MG 24 hr tablet TAKE 1 TABLET BY MOUTH IN THE MORNING, AND 1  AND 1/2 TABLETS IN THE EVENING AS DIRECTED 225 tablet 3  . Multiple Vitamins-Minerals (MULTIPLE VITAMINS/WOMENS PO) Take by mouth.    . potassium chloride (KLOR-CON 10) 10 MEQ tablet Take 1 tablet (10 mEq total) by mouth 2 (two) times daily as needed. 180 tablet 3   No current facility-administered medications on file prior to visit.    Past Medical History  Diagnosis Date  . Hypertension   . Hx of measles     childhood  . CHF (congestive heart failure) 03/04/2011  . Edema 03/04/2011  . SOB (shortness of breath) 03/04/2011  . Tachycardia 03/04/2011    Past Surgical History  Procedure Laterality Date  . Tonsillectomy  1943  . Cardiac catheterization  05/29/11    Social History  reports that she has never smoked. She has never used smokeless tobacco. She reports that she does not drink alcohol or use illicit drugs.  Family History family history includes Heart disease in her father; Hypertension in her brother, sister, and sister; Other in her mother.   Review of Systems  Constitutional: Negative.   Respiratory: Negative.   Cardiovascular: Negative.   Gastrointestinal: Negative.   Musculoskeletal: Negative.   Neurological: Negative.   Hematological: Negative.   Psychiatric/Behavioral: Negative.   All other systems reviewed and are negative.   BP 130/80 mmHg  Pulse 78  Ht  (1.626 m)  Wt 158 lb 8 oz (71.895 kg)  BMI 27.19 kg/m2  Physical Exam  Constitutional: She is oriented to person, place, and time. She appears well-developed and well-nourished.  HENT:  Head: Normocephalic.  Nose: Nose normal.  Mouth/Throat: Oropharynx is clear and moist.  Eyes: Conjunctivae are normal. Pupils are equal, round, and reactive to light.  Neck: Normal range of motion. Neck supple. No JVD present.  Cardiovascular: Normal rate, regular rhythm, normal heart sounds and intact distal pulses.  Exam reveals no gallop and no friction rub.   No murmur heard. Pulmonary/Chest: Effort normal  and breath sounds normal. No respiratory distress. She has no wheezes. She has no rales. She exhibits no tenderness.  Abdominal: Soft. Bowel sounds are normal. She exhibits no distension. There is no tenderness.  Musculoskeletal: Normal range of motion. She exhibits no edema or tenderness.  Lymphadenopathy:    She has no cervical adenopathy.  Neurological: She is alert and oriented to person, place, and time. Coordination normal.  Skin: Skin is warm and dry. No rash noted. No erythema.  Psychiatric: She has a normal mood and affect. Her behavior is normal. Judgment and thought content normal.

## 2014-10-12 NOTE — Assessment & Plan Note (Signed)
No recent episodes of tachycardia. Encouraged her to stay on her metoprolol

## 2014-10-12 NOTE — Assessment & Plan Note (Signed)
Tolerating Lipitor 40 mg daily with zetia 10 mg daily Recommended she come in for fasting lipids at her convenience. Order has been placed

## 2014-10-12 NOTE — Assessment & Plan Note (Signed)
Continues to have issues with anxiety, better recently as her health has been doing well

## 2014-10-12 NOTE — Patient Instructions (Signed)
You are doing well. No medication changes were made.  Please call us if you have new issues that need to be addressed before your next appt.  Your physician wants you to follow-up in: 6 months.  You will receive a reminder letter in the mail two months in advance. If you don't receive a letter, please call our office to schedule the follow-up appointment.   

## 2014-10-12 NOTE — Assessment & Plan Note (Signed)
Currently with no symptoms of angina. No further workup at this time. Continue current medication regimen. 

## 2014-10-12 NOTE — Assessment & Plan Note (Signed)
She appears relatively euvolemic on today's visit. No changes to her medications One option would be to change her to entresto if she has any symptoms of heart failure. Currently she does not want to change her medications that she feels well

## 2015-01-25 ENCOUNTER — Other Ambulatory Visit: Payer: Self-pay | Admitting: Cardiovascular Disease

## 2015-01-25 NOTE — Telephone Encounter (Signed)
Requested Prescriptions   Pending Prescriptions Disp Refills  . lisinopril (PRINIVIL,ZESTRIL) 40 MG tablet [Pharmacy Med Name: LISINOPRIL 40 MG TABLET] 90 tablet 3    Sig: take 1 tablet by mouth once daily

## 2015-02-25 ENCOUNTER — Other Ambulatory Visit: Payer: Self-pay | Admitting: Cardiovascular Disease

## 2015-03-04 ENCOUNTER — Other Ambulatory Visit: Payer: Self-pay | Admitting: Cardiovascular Disease

## 2015-03-04 NOTE — Telephone Encounter (Signed)
Requested Prescriptions   Pending Prescriptions Disp Refills  . cloNIDine (CATAPRES) 0.1 MG tablet [Pharmacy Med Name: CLONIDINE HCL 0.1 MG TABLET] 180 tablet 3    Sig: take 1 tablet by mouth twice a day  . amLODipine (NORVASC) 10 MG tablet [Pharmacy Med Name: AMLODIPINE BESYLATE 10 MG TAB] 90 tablet 3    Sig: take 1 tablet by mouth once daily  ;

## 2015-03-12 ENCOUNTER — Other Ambulatory Visit: Payer: Self-pay | Admitting: Cardiovascular Disease

## 2015-03-12 DIAGNOSIS — E785 Hyperlipidemia, unspecified: Secondary | ICD-10-CM | POA: Diagnosis not present

## 2015-03-12 DIAGNOSIS — I159 Secondary hypertension, unspecified: Secondary | ICD-10-CM | POA: Diagnosis not present

## 2015-03-12 DIAGNOSIS — I5022 Chronic systolic (congestive) heart failure: Secondary | ICD-10-CM | POA: Diagnosis not present

## 2015-03-12 DIAGNOSIS — R000 Tachycardia, unspecified: Secondary | ICD-10-CM | POA: Diagnosis not present

## 2015-03-13 ENCOUNTER — Other Ambulatory Visit: Payer: Self-pay

## 2015-03-13 DIAGNOSIS — R000 Tachycardia, unspecified: Secondary | ICD-10-CM

## 2015-03-13 DIAGNOSIS — I159 Secondary hypertension, unspecified: Secondary | ICD-10-CM

## 2015-03-13 DIAGNOSIS — I5022 Chronic systolic (congestive) heart failure: Secondary | ICD-10-CM

## 2015-03-13 DIAGNOSIS — E785 Hyperlipidemia, unspecified: Secondary | ICD-10-CM

## 2015-03-13 LAB — LIPID PANEL WITH LDL/HDL RATIO
CHOLESTEROL TOTAL: 129 mg/dL (ref 100–199)
HDL: 59 mg/dL (ref >39)
LDL Calculated: 53 mg/dL (ref 0–99)
LDl/HDL Ratio: 0.9 ratio units (ref 0.0–3.2)
Triglycerides: 84 mg/dL (ref 0–149)
VLDL CHOLESTEROL CAL: 17 mg/dL (ref 5–40)

## 2015-03-13 LAB — HEPATIC FUNCTION PANEL
ALBUMIN: 4.2 g/dL (ref 3.5–4.8)
ALK PHOS: 74 IU/L (ref 39–117)
ALT: 41 IU/L — ABNORMAL HIGH (ref 0–32)
AST: 23 IU/L (ref 0–40)
BILIRUBIN TOTAL: 0.4 mg/dL (ref 0.0–1.2)
Bilirubin, Direct: 0.11 mg/dL (ref 0.00–0.40)
TOTAL PROTEIN: 6.5 g/dL (ref 6.0–8.5)

## 2015-04-16 ENCOUNTER — Ambulatory Visit (INDEPENDENT_AMBULATORY_CARE_PROVIDER_SITE_OTHER): Payer: Medicare Other | Admitting: Cardiovascular Disease

## 2015-04-16 ENCOUNTER — Encounter: Payer: Self-pay | Admitting: Cardiovascular Disease

## 2015-04-16 VITALS — BP 128/76 | HR 67 | Ht 64.0 in | Wt 162.2 lb

## 2015-04-16 DIAGNOSIS — I159 Secondary hypertension, unspecified: Secondary | ICD-10-CM

## 2015-04-16 DIAGNOSIS — E785 Hyperlipidemia, unspecified: Secondary | ICD-10-CM

## 2015-04-16 DIAGNOSIS — I5022 Chronic systolic (congestive) heart failure: Secondary | ICD-10-CM | POA: Diagnosis not present

## 2015-04-16 DIAGNOSIS — R000 Tachycardia, unspecified: Secondary | ICD-10-CM

## 2015-04-16 DIAGNOSIS — I2510 Atherosclerotic heart disease of native coronary artery without angina pectoris: Secondary | ICD-10-CM

## 2015-04-16 NOTE — Assessment & Plan Note (Signed)
Encouraged her to stay on her Lipitor 

## 2015-04-16 NOTE — Progress Notes (Signed)
Patient ID: Anna Weaver, female    DOB: [birth year], 82 y.o.   MRN: [id number]  HPI Comments:  Anna Weaver is a very pleasant 82 year-old woman with hyperlipidemia, patient of Dr. Sherrie Mustache, with shortness of breath and edema in Jan 2013, found to be in systolic CHF,  EF 25%, started on Lasix with a significant weight loss over 2013,  Stress test showing anterior wall ischemia, with recent cardiac catheterization at [facility] on 05/29/2011 showing occluded proximal LAD with collateral vessels, also 60% OM 2 disease, ejection fraction 40%. She presents for follow-up of her coronary artery disease and systolic dysfunction  In follow-up today, she reports that she is doing very well, probably the best she has been in a long time She is walking, exercising on a regular basis, uses her fit bit to compete with her husband Weight is stable, trying to eat better Denies any chest pain or shortness of breath on exertion She reports blood pressure numbers have been very well controlled at home Takes Lasix only as needed  Recent lab work reviewed with her showing total cholesterol 129, tolerating the zetia Hemoglobin A1c 5.6  Other past medical history reviewed Previous echocardiogram January 2013 showed ejection fraction less than 25%. Right ventricular systolic pressures were significantly elevated, estimated at 60 mm of mercury or higher.    No Known Allergies  Current Outpatient Prescriptions on File Prior to Visit  Medication Sig Dispense Refill  . amLODipine (NORVASC) 10 MG tablet take 1 tablet by mouth once daily 90 tablet 3  . aspirin 81 MG tablet Take 81 mg by mouth 2 (two) times daily.     Marland Kitchen atorvastatin (LIPITOR) 40 MG tablet Take 40 mg by mouth daily.  0  . cloNIDine (CATAPRES) 0.1 MG tablet take 1 tablet by mouth twice a day 180 tablet 3  . ezetimibe (ZETIA) 10 MG tablet Take 1 tablet (10 mg total) by mouth daily. 30 tablet 11  . furosemide (LASIX) 20 MG tablet take 1 tablet by mouth twice a  day if needed 180 tablet 3  . lisinopril (PRINIVIL,ZESTRIL) 40 MG tablet take 1 tablet by mouth once daily 90 tablet 3  . metoprolol succinate (TOPROL-XL) 50 MG 24 hr tablet TAKE 1 TABLET BY MOUTH IN THE MORNING, AND 1 AND 1/2 TABLETS IN THE EVENING AS DIRECTED 225 tablet 3  . Multiple Vitamins-Minerals (MULTIPLE VITAMINS/WOMENS PO) Take by mouth.    . potassium chloride (K-DUR,KLOR-CON) 10 MEQ tablet take 1 tablet by mouth twice a day if needed 180 tablet 3   No current facility-administered medications on file prior to visit.    Past Medical History  Diagnosis Date  . Hypertension   . Hx of measles     childhood  . CHF (congestive heart failure) (HCC) 03/04/2011  . Edema 03/04/2011  . SOB (shortness of breath) 03/04/2011  . Tachycardia 03/04/2011    Past Surgical History  Procedure Laterality Date  . Tonsillectomy  1943  . Cardiac catheterization  05/29/11    Social History  reports that she has never smoked. She has never used smokeless tobacco. She reports that she does not drink alcohol or use illicit drugs.  Family History family history includes Heart disease in her father; Hypertension in her brother, sister, and sister; Other in her mother.   Review of Systems  Constitutional: Negative.   Respiratory: Negative.   Cardiovascular: Negative.   Gastrointestinal: Negative.   Musculoskeletal: Negative.   Neurological: Negative.   Hematological: Negative.  Psychiatric/Behavioral: Negative.   All other systems reviewed and are negative.   BP 128/76 mmHg  Pulse 67  Ht  (1.626 m)  Wt 162 lb 4 oz (73.596 kg)  BMI 27.84 kg/m2  Physical Exam  Constitutional: She is oriented to person, place, and time. She appears well-developed and well-nourished.  HENT:  Head: Normocephalic.  Nose: Nose normal.  Mouth/Throat: Oropharynx is clear and moist.  Eyes: Conjunctivae are normal. Pupils are equal, round, and reactive to light.  Neck: Normal range of motion. Neck supple.  No JVD present.  Cardiovascular: Normal rate, regular rhythm, normal heart sounds and intact distal pulses.  Exam reveals no gallop and no friction rub.   No murmur heard. Pulmonary/Chest: Effort normal and breath sounds normal. No respiratory distress. She has no wheezes. She has no rales. She exhibits no tenderness.  Abdominal: Soft. Bowel sounds are normal. She exhibits no distension. There is no tenderness.  Musculoskeletal: Normal range of motion. She exhibits no edema or tenderness.  Lymphadenopathy:    She has no cervical adenopathy.  Neurological: She is alert and oriented to person, place, and time. Coordination normal.  Skin: Skin is warm and dry. No rash noted. No erythema.  Psychiatric: She has a normal mood and affect. Her behavior is normal. Judgment and thought content normal.

## 2015-04-16 NOTE — Assessment & Plan Note (Signed)
Blood pressure is well controlled on today's visit. No changes made to the medications. 

## 2015-04-16 NOTE — Assessment & Plan Note (Signed)
euvolemic on today's visit. Ejection fraction 40% in 2013 by cardiac catheterization No changes to her medications In the past we did contemplate entresto. She has been doing well with no problems, probably the best she has been in 3 years.

## 2015-04-16 NOTE — Assessment & Plan Note (Signed)
Currently with no symptoms of angina. No further workup at this time. Continue current medication regimen. 

## 2015-04-16 NOTE — Patient Instructions (Signed)
You are doing well. No medication changes were made.  Please call us if you have new issues that need to be addressed before your next appt.  Your physician wants you to follow-up in: 12 months.  You will receive a reminder letter in the mail two months in advance. If you don't receive a letter, please call our office to schedule the follow-up appointment. 

## 2015-06-17 DIAGNOSIS — B029 Zoster without complications: Secondary | ICD-10-CM

## 2015-06-17 HISTORY — DX: Zoster without complications: B02.9

## 2015-06-23 ENCOUNTER — Other Ambulatory Visit: Payer: Self-pay | Admitting: Cardiovascular Disease

## 2015-08-22 ENCOUNTER — Other Ambulatory Visit: Payer: Self-pay | Admitting: Cardiovascular Disease

## 2015-10-08 ENCOUNTER — Other Ambulatory Visit: Payer: Self-pay | Admitting: Cardiovascular Disease

## 2015-10-10 DIAGNOSIS — H43813 Vitreous degeneration, bilateral: Secondary | ICD-10-CM | POA: Diagnosis not present

## 2015-12-05 ENCOUNTER — Other Ambulatory Visit: Payer: Self-pay | Admitting: *Deleted

## 2015-12-05 ENCOUNTER — Telehealth: Payer: Self-pay | Admitting: Cardiovascular Disease

## 2015-12-05 MED ORDER — EZETIMIBE 10 MG PO TABS: 10.0000 mg | ORAL_TABLET | Freq: Every day | ORAL | 3 refills | Status: DC

## 2015-12-05 NOTE — Telephone Encounter (Signed)
*  STAT* If patient is at the pharmacy, call can be transferred to refill team.   1. Which medications need to be refilled? (please list name of each medication and dose if known) ZETIA 10 MG tablet . Wants generic.  2. Which pharmacy/location (including street and city if local pharmacy) is medication to be sent to? [facility] on [address]  3. Do they need a 30 day or 90 day supply? 90

## 2015-12-05 NOTE — Telephone Encounter (Signed)
Requested Prescriptions   Signed Prescriptions Disp Refills   ezetimibe (ZETIA) 10 MG tablet 90 tablet 3    Sig: Take 1 tablet (10 mg total) by mouth daily.    Authorizing Provider: GOLLAN, TIMOTHY J    Ordering User: Ireta Pullman C    

## 2015-12-05 NOTE — Telephone Encounter (Signed)
Requested Prescriptions   Signed Prescriptions Disp Refills   ezetimibe (ZETIA) 10 MG tablet 90 tablet 3    Sig: Take 1 tablet (10 mg total) by mouth daily.    Authorizing Provider: GOLLAN, TIMOTHY J    Ordering User: Ary Rudnick C    

## 2016-02-18 ENCOUNTER — Other Ambulatory Visit: Payer: Self-pay | Admitting: Cardiovascular Disease

## 2016-04-14 ENCOUNTER — Encounter: Payer: Self-pay | Admitting: Cardiovascular Disease

## 2016-04-14 ENCOUNTER — Ambulatory Visit (INDEPENDENT_AMBULATORY_CARE_PROVIDER_SITE_OTHER): Payer: Medicare Other | Admitting: Cardiovascular Disease

## 2016-04-14 VITALS — BP 140/80 | HR 79 | Ht 64.0 in | Wt 163.5 lb

## 2016-04-14 DIAGNOSIS — I5022 Chronic systolic (congestive) heart failure: Secondary | ICD-10-CM

## 2016-04-14 DIAGNOSIS — I10 Essential (primary) hypertension: Secondary | ICD-10-CM

## 2016-04-14 DIAGNOSIS — F419 Anxiety disorder, unspecified: Secondary | ICD-10-CM

## 2016-04-14 DIAGNOSIS — E782 Mixed hyperlipidemia: Secondary | ICD-10-CM

## 2016-04-14 DIAGNOSIS — I2510 Atherosclerotic heart disease of native coronary artery without angina pectoris: Secondary | ICD-10-CM

## 2016-04-14 MED ORDER — EZETIMIBE 10 MG PO TABS
10.0000 mg | ORAL_TABLET | Freq: Every day | ORAL | 3 refills | Status: DC
Start: 2016-04-14 — End: 2017-03-25

## 2016-04-14 NOTE — Patient Instructions (Addendum)

## 2016-04-14 NOTE — Progress Notes (Signed)
Cardiology Office Note  Date:  04/14/2016   ID:  Daryel GeraldLinda Gronewold, DOB [birth year], MRN [id number]  PCP:  Mila Merryonald Fisher, MD   Chief Complaint  Patient presents with  . other    12 month follow up. Meds reviewed by the pt. verbally. "doing well."     HPI:  [name] is a very pleasant 83 year-old woman with hyperlipidemia, patient of Dr. Sherrie MustacheFisher, with shortness of breath and edema in Jan 2013, found to be in systolic CHF,  EF 25%, started on Lasix with a significant weight loss over 2013,  Stress test showing anterior wall ischemia, with recent cardiac catheterization at [facility] on 05/29/2011 showing occluded proximal LAD with collateral vessels, also 60% OM 2 disease, ejection fraction 40%. She presents for follow-up of her coronary artery disease and systolic dysfunction  In follow-up today, she reports that she feels well. Reports her blood pressure has been well-controlled, Tolerating her medications including Lipitor and Zetia  Lab work reviewed with her total cholesterol down to 120s Long discussion concerning affordable pricing of the Zetia and where to purchase the medication  She is active, doing more activities with no symptoms of angina. She takes Lasix when necessary  EKG on today's visit shows normal sinus rhythm with rate 83 bpm, T-wave abnormality  Other past medical history reviewed  echocardiogram January 2013 showed ejection fraction less than 25%. Right ventricular systolic pressures were significantly elevated, estimated at 60 mm of mercury or higher.   PMH:   has a past medical history of CHF (congestive heart failure) (HCC) (03/04/2011); Edema (03/04/2011); measles; Hypertension; Shingles (06/2015); SOB (shortness of breath) (03/04/2011); and Tachycardia (03/04/2011).  PSH:    Past Surgical History:  Procedure Laterality Date  . CARDIAC CATHETERIZATION  05/29/11  . TONSILLECTOMY  1943    Current Outpatient Prescriptions  Medication Sig Dispense Refill  .  amLODipine (NORVASC) 10 MG tablet take 1 tablet by mouth once daily 90 tablet 3  . aspirin 81 MG tablet Take 81 mg by mouth 2 (two) times daily.     Marland Kitchen. atorvastatin (LIPITOR) 40 MG tablet take 1 tablet by mouth once daily 90 tablet 3  . cloNIDine (CATAPRES) 0.1 MG tablet take 1 tablet by mouth twice a day 180 tablet 3  . ezetimibe (ZETIA) 10 MG tablet Take 1 tablet (10 mg total) by mouth daily. 90 tablet 3  . furosemide (LASIX) 20 MG tablet take 1 tablet by mouth twice a day if needed 180 tablet 3  . lisinopril (PRINIVIL,ZESTRIL) 40 MG tablet take 1 tablet by mouth once daily 90 tablet 3  . metoprolol succinate (TOPROL-XL) 50 MG 24 hr tablet TAKE 1 TABLET BY MOUTH IN THE MORNING, AND 1 AND 1/2 TABLETS IN THE EVENING AS DIRECTED 225 tablet 3  . Multiple Vitamins-Minerals (MULTIPLE VITAMINS/WOMENS PO) Take by mouth.    . potassium chloride (K-DUR,KLOR-CON) 10 MEQ tablet take 1 tablet by mouth twice a day if needed 180 tablet 3   No current facility-administered medications for this visit.      Allergies:   Patient has no known allergies.   Social History:  The patient  reports that she has never smoked. She has never used smokeless tobacco. She reports that she does not drink alcohol or use drugs.   Family History:   family history includes Heart disease in her father; Hypertension in her brother, sister, and sister; Other in her mother.    Review of Systems: ROS   PHYSICAL EXAM: VS:  BP  140/80 (BP Location: Left Arm, Patient Position: Sitting, Cuff Size: Normal)   Pulse 79   Ht 5\' 4"  (1.626 m)   Wt 163 lb 8 oz (74.2 kg)   BMI 28.06 kg/m  , BMI Body mass index is 28.06 kg/m. GEN: Well nourished, well developed, in no acute distress  HEENT: normal  Neck: no JVD, carotid bruits, or masses Cardiac: RRR; no murmurs, rubs, or gallops,no edema  Respiratory:  clear to auscultation bilaterally, normal work of breathing GI: soft, nontender, nondistended, + BS MS: no deformity or atrophy   Skin: warm and dry, no rash Neuro:  Strength and sensation are intact Psych: euthymic mood, full affect    Recent Labs: No results found for requested labs within last 8760 hours.    Lipid Panel Lab Results  Component Value Date   CHOL 129 03/12/2015   HDL 59 03/12/2015   LDLCALC 53 03/12/2015   TRIG 84 03/12/2015      Wt Readings from Last 3 Encounters:  04/14/16 163 lb 8 oz (74.2 kg)  04/16/15 162 lb 4 oz (73.6 kg)  10/12/14 158 lb 8 oz (71.9 kg)       ASSESSMENT AND PLAN:  Chronic systolic CHF (congestive heart failure) (HCC) - Plan: EKG 12-Lead Appears euvolemic on today's visit, takes Lasix as needed, tolerating metoprolol, ACE inhibitor No changes to her medications Weight stable  Coronary artery disease involving native coronary artery of native heart without angina pectoris - Plan: EKG 12-Lead Currently with no symptoms of angina. No further workup at this time. Continue current medication regimen.  Essential hypertension - Plan: EKG 12-Lead Blood pressure is well controlled on today's visit. No changes made to the medications.  Mixed hyperlipidemia - Plan: EKG 12-Lead Cholesterol at goal Long time spent today looking on the Internet, researched www.goodrx.com, we have printed coupons for her, provided new prescriptions  Anxiety Reassured her that she is doing well No significant medication changes made   Total encounter time more than 25 minutes  Greater than 50% was spent in counseling and coordination of care with the patient   Disposition:   F/U  12 months   Orders Placed This Encounter  Procedures  . EKG 12-Lead     Signed, Dossie Arbour, M.D., Ph.D. 04/14/2016  [facility] [place], [place] [phone]

## 2016-04-15 ENCOUNTER — Ambulatory Visit: Payer: Medicare Other | Admitting: Cardiovascular Disease

## 2016-04-17 ENCOUNTER — Encounter: Payer: Self-pay | Admitting: Cardiovascular Disease

## 2016-05-19 ENCOUNTER — Telehealth: Payer: Self-pay | Admitting: Cardiovascular Disease

## 2016-05-19 NOTE — Telephone Encounter (Signed)
Patient told pharmacy she takes 10 mg of Lipitor. They have 40 mg on file.  Please call to confirm correct dosage

## 2016-05-19 NOTE — Telephone Encounter (Signed)
Spoke with Anna Weaver he stated patient has been getting 10 mg Lipitor and taking them daily instead of the 40 mg that we have on file. He stated he was going to call the patient and that is was a mistake on their end.

## 2016-05-19 NOTE — Telephone Encounter (Signed)
Spoke w/ pt. She reports that she has been taking the wrong dose of Lipitor for 3 months. She states that the pharmacy just called to correct their mistake and he has the correct dose of Lipitor and will resume 40 mg daily. Asked her to let us know if she would like to check her cholesterol at any time. She is appreciative of the call and will double check all of her rx's from now on.

## 2016-05-19 NOTE — Telephone Encounter (Signed)
Left message for pt to call back  °

## 2016-08-12 ENCOUNTER — Other Ambulatory Visit: Payer: Self-pay | Admitting: Cardiovascular Disease

## 2016-08-31 ENCOUNTER — Other Ambulatory Visit: Payer: Self-pay | Admitting: Cardiovascular Disease

## 2016-11-10 ENCOUNTER — Other Ambulatory Visit: Payer: Self-pay | Admitting: Cardiovascular Disease

## 2016-11-11 ENCOUNTER — Telehealth: Payer: Self-pay | Admitting: Cardiovascular Disease

## 2016-11-11 DIAGNOSIS — I5022 Chronic systolic (congestive) heart failure: Secondary | ICD-10-CM

## 2016-11-11 NOTE — Telephone Encounter (Signed)
Patient called in due to elevated blood pressures and states that she has had no symptoms at all and has actually felt great. She states that she has been on the same medication regimen for over a year or so and just not sure if something needs to be changed or not. Advised her to continue monitoring her blood pressures on a daily basis and to please give [place] a call back if it persistently stays 140/80 or higher. Let her know that I would make Dr. Mariah Milling aware as well. She was appreciative for the call and had no further questions at this time.

## 2016-11-11 NOTE — Telephone Encounter (Signed)
Patient has concerns about her recent blood pressure   Log of Digital Blood Pressures   03/05/16 137/86  64  09/24/16  138/80  66  11/09/16 167/107 67  11/10/16 174/99  67  11/11/16 150/100 72  Patient denies sx at this time but took bp randomly and concerned that bp is too high  Please call to discuss

## 2016-11-20 NOTE — Telephone Encounter (Signed)
Pt c/o BP issue: STAT if pt c/o blurred vision, one-sided weakness or slurred speech  1. What are your last 5 BP readings?   09/29: 155/98 pul 63 09/30: 144/92 pul 62 10/1: 144/89 pul 66 10/2: 154/103 pul 75 10/3: 175/116 pul 75 10/4: 176/123 pul 67 This morning 10/5: 173/104 pul 72  2. Are you having any other symptoms (ex. Dizziness, headache, blurred vision, passed out)? No symptoms  3. What is your BP issue? Elevated  PT calling back to give BP readings of past 7 days Please call to discuss

## 2016-11-20 NOTE — Telephone Encounter (Signed)
Spoke with patient and her blood pressures are consistently staying elevated. Advised her to go ahead and take another 1/2 tablet of her metoprolol now and then start taking 1 1/2 tablets in the AM and then 2 tablets in the PM. Instructed her to continue monitoring her blood pressures and to give [place] a call next week with those readings. She verbalized understanding of our conversation, agreement with plan, and had no further questions at this time. Let her know that I would route this to Dr. Mariah Milling for review and if he has any different recommendations.

## 2016-11-30 NOTE — Telephone Encounter (Signed)
Patient has log of BP Patient using digital cuff that dr. Mariah Milling has approved   11/21/16  154/94  66 11/23/16 158/100 60  11/24/16 179/106 64 11/26/16 149/94  66 11/27/16 128/86  59 11/28/16 155/115 62 11/30/16 178/125 71

## 2016-12-01 NOTE — Telephone Encounter (Signed)
S/w patient.  Patient's BP is still elevated despite taking Metoprolol 1 1/2 tablets in the morning and 2 tablets in the evening. 11/21/16            154/94             66 11/23/16            158/100           60         11/24/16            179/106           64 11/26/16          149/94             66 11/27/16          128/86             59 11/28/16          155/115           62 11/30/16          178/125           71 No complaints, denies headache, blurred vision, chest pain, dizziness, or shortness of breath. She states "I feel fine." Will route to Dr Mariah Milling for advice.

## 2016-12-04 NOTE — Telephone Encounter (Signed)
Would consider taking Lasix 2 or 3 times per week with potassium for blood pressure  There is no blood work since January 2017 She plan on seeing primary care for routine lab work If not we will need basic metabolic panel in 1 month

## 2016-12-07 NOTE — Telephone Encounter (Signed)
Reviewed recommendations per Dr. Mariah MillingGollan and order entered into Epic for labs. She verbalized understanding and had no further questions at this time. She asked if Mychart messages would be acceptable and confirmed that would be a great way to send me her blood pressure readings. She was very appreciative for the call back and had no further questions at this time.

## 2016-12-16 ENCOUNTER — Other Ambulatory Visit
Admission: RE | Admit: 2016-12-16 | Discharge: 2016-12-16 | Disposition: A | Discharge status: Home / Self Care | Payer: Medicare Other | Source: Ambulatory Visit | Attending: Cardiovascular Disease | Admitting: Cardiovascular Disease

## 2016-12-16 ENCOUNTER — Other Ambulatory Visit: Payer: Self-pay | Admitting: *Deleted

## 2016-12-16 ENCOUNTER — Telehealth: Payer: Self-pay | Admitting: Cardiovascular Disease

## 2016-12-16 DIAGNOSIS — I5022 Chronic systolic (congestive) heart failure: Secondary | ICD-10-CM

## 2016-12-16 LAB — BASIC METABOLIC PANEL
ANION GAP: 12 (ref 5–15)
BUN: 28 mg/dL — ABNORMAL HIGH (ref 6–20)
CO2: 28 mmol/L (ref 22–32)
Calcium: 10.2 mg/dL (ref 8.9–10.3)
Chloride: 102 mmol/L (ref 101–111)
Creatinine, Ser: 1.43 mg/dL — ABNORMAL HIGH (ref 0.44–1.00)
GFR, EST AFRICAN AMERICAN: 39 mL/min — AB (ref >60)
GFR, EST NON AFRICAN AMERICAN: 33 mL/min — AB (ref >60)
Glucose, Bld: 133 mg/dL — ABNORMAL HIGH (ref 65–99)
Potassium: 4.2 mmol/L (ref 3.5–5.1)
Sodium: 142 mmol/L (ref 135–145)

## 2016-12-16 NOTE — Telephone Encounter (Signed)
Spoke with patient and she reports that she still has some elevated readings. She reports that she stopped the increased metoprolol doses thinking it was not working. Advised her to try going back to that increased dosage and lets see what her lab results show. She verbalized understanding and had no further questions at this time.

## 2016-12-16 NOTE — Telephone Encounter (Signed)
Left voicemail message for patient to call back regarding BMP lab test needed.

## 2017-02-07 ENCOUNTER — Other Ambulatory Visit: Payer: Self-pay | Admitting: Cardiovascular Disease

## 2017-03-25 ENCOUNTER — Other Ambulatory Visit: Payer: Self-pay | Admitting: Cardiovascular Disease

## 2017-04-14 ENCOUNTER — Ambulatory Visit: Payer: Medicare Other | Admitting: Cardiovascular Disease

## 2017-04-15 ENCOUNTER — Ambulatory Visit: Payer: Medicare Other | Admitting: Physician Assistant

## 2017-04-15 ENCOUNTER — Encounter: Payer: Self-pay | Admitting: Physician Assistant

## 2017-04-15 NOTE — Progress Notes (Signed)
Cardiology Office Note Date:  04/16/2017  Patient ID:  Anna Weaver, Anna Weaver 1933/06/01, MRN [id number] PCP:  Malva Limes, MD  Cardiologist:  Dr. Mariah Milling, MD    Chief Complaint: Follow-up  History of Present Illness: Anna Weaver is a 84 y.o. female with history of CAD medically managed as detailed below, chronic systolic CHF due to ICM, pulmonary hypertension, HTN, HLD, measles, and shingles who presents for follow-up of her CAD/ICM.  Echo in 2013 for SOB and edema showed EF of 20-25%, diffuse hypokinesis, mild mitral regurgitation, mildly dilated left atrium, mild tricuspid regurgitation, PASP 55 mmHg. Lexiscan Myoview in 2013 showed a large region of perfusion defect in the anterior and septal region with ischemia noted in the septal wall. EF 35%. Overall, this was a high-risk scan. LHC 05/2011 showed proximal LAD 100% stenosed, proximal LCx 20% stenosed, OM2 60% stenosed, proximal RCA 30% stenosed. Her proximal LAD lesion was felt to be the culprit lesion for her abnormal stress test. She was noted to have collaterals from the LCx and RCA to the LAD. EF 40%. Medical management was advised. She was most recently seen by Dr. Mariah Milling on 04/14/2016 for routine follow up and was doing well at that time. No changes were made.   We were notified she had been getting the wrong strength of Lipitor from her pharmacy for several months. Prescribed Lipitor 40 mg daily and she had been getting 10 mg daily. She called in 10/2016 with elevated BP readings. She was instructed on the phone to increase her Toprol XL to 75 mg in the morning and 100 mg in the evening. BP continued to run high. She was advised to start Lasix 20 mg prn.   Labs 11/2016: Na 142, K+ 4.2, BUN 28, SCr 1.43. Labs 02/2015: LDL 53, ALT 41  She comes in doing well today.  Blood pressures at home have significantly improved to the 120s-130s systolic.  Heart rates in the 60s-70s bpm.  She is currently taking Toprol 50 mg in the morning and 75 mg  in the evening.  She is taking Lasix 20 mg daily.  She has cut out carbs (breads and sweets only), with a noted weight loss from 163-->158 pounds when comparing her office visit in 03/2016 to today.  She denies any chest pain, shortness of breath, diaphoresis, nausea, vomiting, dizziness, presyncope, or syncope.  She also denies any orthopnea, PND, cough, early satiety, or lower extremity swelling.  She is very pleased with her weight loss and blood pressure readings.  She has no concerns at this time.   Past Medical History:  Diagnosis Date  . CAD (coronary artery disease)    a. LHC 05/2011 showed proximal LAD 100% stenosed, proximal LCx 20% stenosed, OM2 60% stenosed, proximal RCA 30% stenosed. Her proximal LAD lesion was felt to be the culprit lesion for her abnormal stress test. She was noted to have collaterals from the LCx and RCA to the LAD. EF 40%. Medical management was advised  . Chronic systolic CHF (congestive heart failure) (HCC) 03/04/2011   a. TTE 2013: EF of 20-25%, diffuse hypokinesis, mild mitral regurgitation, mildly dilated left atrium, mild tricuspid regurgitation, PASP 55 mmHg  . Hx of measles    childhood  . Hypertension   . Pulmonary hypertension (HCC)   . Shingles 06/2015    Past Surgical History:  Procedure Laterality Date  . CARDIAC CATHETERIZATION  05/29/11  . TONSILLECTOMY  1943    Current Meds  Medication Sig  . amLODipine (  NORVASC) 10 MG tablet take 1 tablet by mouth once daily  . aspirin 81 MG tablet Take 81 mg by mouth 2 (two) times daily.   Marland Kitchen atorvastatin (LIPITOR) 40 MG tablet take 1 tablet by mouth once daily  . cloNIDine (CATAPRES) 0.1 MG tablet take 1 tablet by mouth twice a day  . ezetimibe (ZETIA) 10 MG tablet TAKE ONE TABLET BY MOUTH ONE TIME DAILY   . furosemide (LASIX) 20 MG tablet take 1 tablet by mouth twice a day if needed  . lisinopril (PRINIVIL,ZESTRIL) 40 MG tablet take 1 tablet by mouth once daily  . metoprolol succinate (TOPROL-XL) 50 MG  24 hr tablet take 1 tablet by mouth every morning and 1 1/2 tablets every evening as directed  . Multiple Vitamins-Minerals (MULTIPLE VITAMINS/WOMENS PO) Take by mouth.  . potassium chloride (K-DUR,KLOR-CON) 10 MEQ tablet take 1 tablet by mouth twice a day if needed    Allergies:   Patient has no known allergies.   Social History:  The patient  reports that  has never smoked. she has never used smokeless tobacco. She reports that she does not drink alcohol or use drugs.   Family History:  The patient's family history includes Heart disease in her father; Hypertension in her brother, sister, and sister; Other in her mother.  ROS:   Review of Systems  Constitutional: Negative for chills, diaphoresis, fever, malaise/fatigue and weight loss.  HENT: Negative for congestion.   Eyes: Negative for discharge and redness.  Respiratory: Negative for cough, hemoptysis, sputum production, shortness of breath and wheezing.   Cardiovascular: Negative for chest pain, palpitations, orthopnea, claudication, leg swelling and PND.  Gastrointestinal: Negative for abdominal pain, blood in stool, heartburn, melena, nausea and vomiting.  Genitourinary: Negative for hematuria.  Musculoskeletal: Negative for falls and myalgias.  Skin: Negative for rash.  Neurological: Negative for dizziness, tingling, tremors, sensory change, speech change, focal weakness, loss of consciousness and weakness.  Endo/Heme/Allergies: Does not bruise/bleed easily.  Psychiatric/Behavioral: Negative for substance abuse. The patient is not nervous/anxious.   All other systems reviewed and are negative.    PHYSICAL EXAM:  VS:  BP 122/80 (BP Location: Left Arm, Patient Position: Sitting, Cuff Size: Normal)   Pulse 82   Ht 5\' 4"  (1.626 m)   Wt 158 lb 8 oz (71.9 kg)   BMI 27.21 kg/m  BMI: Body mass index is 27.21 kg/m.  Physical Exam  Constitutional: She is oriented to person, place, and time. She appears well-developed and  well-nourished.  HENT:  Head: Normocephalic and atraumatic.  Eyes: Right eye exhibits no discharge. Left eye exhibits no discharge.  Neck: Normal range of motion. No JVD present.  Cardiovascular: Normal rate, regular rhythm, S1 normal, S2 normal and normal heart sounds. Exam reveals no distant heart sounds, no friction rub, no midsystolic click and no opening snap.  No murmur heard. Pulses:      Posterior tibial pulses are 2+ on the right side, and 2+ on the left side.  Pulmonary/Chest: Effort normal and breath sounds normal. No respiratory distress. She has no decreased breath sounds. She has no wheezes. She has no rales. She exhibits no tenderness.  Abdominal: Soft. She exhibits no distension. There is no tenderness.  Musculoskeletal: She exhibits no edema.  Neurological: She is alert and oriented to person, place, and time.  Skin: Skin is warm and dry. No cyanosis. Nails show no clubbing.  Psychiatric: She has a normal mood and affect. Her speech is normal and behavior is  normal. Judgment and thought content normal.     EKG:  Was ordered and interpreted by me today. Shows NSR, 82 bpm, left axis deviation, LVH, [place] ST-T changes  Recent Labs: 12/16/2016: BUN 28; Creatinine, Ser 1.43; Potassium 4.2; Sodium 142  No results found for requested labs within last 8760 hours.   CrCl cannot be calculated (Patient's most recent lab result is older than the maximum 21 days allowed.).   Wt Readings from Last 3 Encounters:  04/16/17 158 lb 8 oz (71.9 kg)  04/14/16 163 lb 8 oz (74.2 kg)  04/16/15 162 lb 4 oz (73.6 kg)     Other studies reviewed: Additional studies/records reviewed today include: summarized above  ASSESSMENT AND PLAN:  1. CAD of native coronary artery without angina: No symptoms concerning for angina at this time.  Continue current medications including aspirin 81 mg daily, Norvasc 10 mg daily, Lipitor 40 mg daily, Zetia 10 mg daily, lisinopril 40 mg daily, and Toprol-XL  50 mg in the morning and 75 mg in the evening.  No plans for further ischemic evaluation at this time.  2. Chronic systolic CHF due to ischemic cardia myopathy: She does not appear grossly volume overloaded at the continue Lasix 20 mg daily, Toprol-XL as above and lisinopril.  CHF education provided.  3. Essential hypertension: Blood pressure is well controlled at home and at today's visit.  Continue current medications as above.  4. Hyperlipidemia: Continue Lipitor 40 mg daily.  Check fasting lipid and liver function.   Disposition: F/u with Dr. Mariah MillingGollan in 6 months.  Current medicines are reviewed at length with the patient today.  The patient did not have any concerns regarding medicines.  Signed, Eula Listenyan Clevie Prout, PA-C 04/16/2017 2:01 PM     [facility] [address] [place], [place] [address] [phone]

## 2017-04-16 ENCOUNTER — Encounter: Payer: Self-pay | Admitting: Physician Assistant

## 2017-04-16 ENCOUNTER — Ambulatory Visit (INDEPENDENT_AMBULATORY_CARE_PROVIDER_SITE_OTHER): Payer: Medicare Other | Admitting: Physician Assistant

## 2017-04-16 VITALS — BP 122/80 | HR 82 | Ht 64.0 in | Wt 158.5 lb

## 2017-04-16 DIAGNOSIS — E782 Mixed hyperlipidemia: Secondary | ICD-10-CM | POA: Diagnosis not present

## 2017-04-16 DIAGNOSIS — I10 Essential (primary) hypertension: Secondary | ICD-10-CM | POA: Diagnosis not present

## 2017-04-16 DIAGNOSIS — I2510 Atherosclerotic heart disease of native coronary artery without angina pectoris: Secondary | ICD-10-CM | POA: Diagnosis not present

## 2017-04-16 DIAGNOSIS — I5022 Chronic systolic (congestive) heart failure: Secondary | ICD-10-CM

## 2017-04-16 NOTE — Patient Instructions (Signed)
Medication Instructions:  Your physician recommends that you continue on your current medications as directed. Please refer to the Current Medication list given to you today.   Labwork: Lipid and liver profile today  Testing/Procedures: none  Follow-Up: Your physician wants you to follow-up in: 6 months with Dr. Mariah MillingGollan.  You will receive a reminder letter in the mail two months in advance. If you don't receive a letter, please call our office to schedule the follow-up appointment.   Any Other Special Instructions Will Be Listed Below (If Applicable).     If you need a refill on your cardiac medications before your next appointment, please call your pharmacy.

## 2017-04-17 LAB — LIPID PANEL
CHOLESTEROL TOTAL: 136 mg/dL (ref 100–199)
Chol/HDL Ratio: 2.4 ratio (ref 0.0–4.4)
HDL: 56 mg/dL (ref >39)
LDL Calculated: 61 mg/dL (ref 0–99)
Triglycerides: 95 mg/dL (ref 0–149)
VLDL CHOLESTEROL CAL: 19 mg/dL (ref 5–40)

## 2017-04-17 LAB — HEPATIC FUNCTION PANEL
ALK PHOS: 79 IU/L (ref 39–117)
ALT: 21 IU/L (ref 0–32)
AST: 22 IU/L (ref 0–40)
Albumin: 4.4 g/dL (ref 3.5–4.7)
Bilirubin Total: 0.4 mg/dL (ref 0.0–1.2)
Bilirubin, Direct: 0.12 mg/dL (ref 0.00–0.40)
Total Protein: 7.1 g/dL (ref 6.0–8.5)

## 2017-07-14 ENCOUNTER — Encounter: Payer: Self-pay | Admitting: Physician Assistant

## 2017-10-29 ENCOUNTER — Other Ambulatory Visit: Payer: Self-pay | Admitting: Cardiovascular Disease

## 2017-11-24 ENCOUNTER — Other Ambulatory Visit: Payer: Self-pay | Admitting: Cardiovascular Disease

## 2017-12-08 ENCOUNTER — Other Ambulatory Visit: Payer: Self-pay | Admitting: Cardiovascular Disease

## 2018-01-29 ENCOUNTER — Other Ambulatory Visit: Payer: Self-pay | Admitting: Cardiovascular Disease

## 2018-04-19 NOTE — Progress Notes (Signed)
Cardiology Office Note  Date:  04/20/2018   ID:  Anna Weaver, DOB [birth year], MRN [id number]  PCP:  Malva Limes, MD   Chief Complaint  Patient presents with  . Other    Past due 6 month. Patient states she has been doing good. Patient was last seen 04/16/17. Meds reviewed verbally with patient.     HPI:  Anna Weaver is a very pleasant 85 year-old woman with  hyperlipidemia,   Jan 2013, found to be in systolic CHF,  EF 25%,  started on Lasix with a significant weight loss over 2013,  Stress test showing anterior wall ischemia,  cardiac catheterization at [facility] on 05/29/2011 showing occluded proximal LAD with collateral vessels, also 60% OM 2 disease, ejection fraction 40%. She presents for follow-up of her coronary artery disease and systolic dysfunction  Feels well, Takes lasix once a day Weight stable 163 down to 160 pounds Denies any significant lower extremity edema No PND orthopnea Active but no regular exercise program  Blood pressure well controlled at home Tolerating Lipitor and Zetia Buys a years worth of Zetia at [facility]  Denies anginal symptoms  EKG personally reviewed by myself on todays visit Shows normal sinus rhythm with rate 74 bpm no significant ST or T wave changes  Other past medical history reviewed  echocardiogram January 2013 showed ejection fraction less than 25%. Right ventricular systolic pressures were significantly elevated, estimated at 60 mm of mercury or higher.   PMH:   has a past medical history of CAD (coronary artery disease), Chronic systolic CHF (congestive heart failure) (HCC) (03/04/2011), measles, Hypertension, Pulmonary hypertension (HCC), and Shingles (06/2015).  PSH:    Past Surgical History:  Procedure Laterality Date  . CARDIAC CATHETERIZATION  05/29/11  . TONSILLECTOMY  1943    Current Outpatient Medications  Medication Sig Dispense Refill  . amLODipine (NORVASC) 10 MG tablet TAKE 1 TABLET BY MOUTH ONCE DAILY 90 tablet 3   . aspirin 81 MG tablet Take 81 mg by mouth 2 (two) times daily.     Marland Kitchen atorvastatin (LIPITOR) 40 MG tablet TAKE 1 TABLET BY MOUTH ONCE DAILY 90 tablet 0  . cloNIDine (CATAPRES) 0.1 MG tablet TAKE 1 TABLET BY MOUTH TWICE A DAY 180 tablet 3  . ezetimibe (ZETIA) 10 MG tablet TAKE ONE TABLET BY MOUTH ONE TIME DAILY  360 tablet 2  . furosemide (LASIX) 20 MG tablet TAKE 1 TABLET BY MOUTH TWICE A DAY IF NEEDED 180 tablet 3  . lisinopril (PRINIVIL,ZESTRIL) 40 MG tablet TAKE 1 TABLET BY MOUTH ONCE DAILY 90 tablet 3  . metoprolol succinate (TOPROL-XL) 50 MG 24 hr tablet TAKE 1 TABLET BY MOUTH EVERY MORNING AND 1 1/2 TABLETS EVERY EVENING AS DIRECTED 225 tablet 0  . Multiple Vitamins-Minerals (MULTIPLE VITAMINS/WOMENS PO) Take by mouth.    . potassium chloride (K-DUR,KLOR-CON) 10 MEQ tablet TAKE 1 TABLET BY MOUTH TWICE A DAY IF NEEDED 180 tablet 3   No current facility-administered medications for this visit.      Allergies:   Patient has no known allergies.   Social History:  The patient  reports that she has never smoked. She has never used smokeless tobacco. She reports that she does not drink alcohol or use drugs.   Family History:   family history includes Heart disease in her father; Hypertension in her brother, sister, and sister; Other in her mother.    Review of Systems: Review of Systems  Constitutional: Negative.   Respiratory: Negative.   Cardiovascular:  Negative.   Gastrointestinal: Negative.   Musculoskeletal: Negative.   Neurological: Negative.   Psychiatric/Behavioral: Negative.   All other systems reviewed and are negative.    PHYSICAL EXAM: VS:  BP 128/86 (BP Location: Left Arm, Patient Position: Sitting, Cuff Size: Normal)   Pulse 74   Ht 5\' 4"  (1.626 m)   Wt 160 lb 4 oz (72.7 kg) Comment: 155 at home without clothing  BMI 27.51 kg/m  , BMI Body mass index is 27.51 kg/m. GEN: Well nourished, well developed, in no acute distress  HEENT: normal  Neck: no JVD,  carotid bruits, or masses Cardiac: RRR; no murmurs, rubs, or gallops,no edema  Respiratory:  clear to auscultation bilaterally, normal work of breathing GI: soft, nontender, nondistended, + BS MS: no deformity or atrophy  Skin: warm and dry, no rash Neuro:  Strength and sensation are intact Psych: euthymic mood, full affect   Recent Labs: No results found for requested labs within last 8760 hours.    Lipid Panel Lab Results  Component Value Date   CHOL 136 04/16/2017   HDL 56 04/16/2017   LDLCALC 61 04/16/2017   TRIG 95 04/16/2017      Wt Readings from Last 3 Encounters:  04/20/18 160 lb 4 oz (72.7 kg)  04/16/17 158 lb 8 oz (71.9 kg)  04/14/16 163 lb 8 oz (74.2 kg)       ASSESSMENT AND PLAN:  Chronic systolic CHF (congestive heart failure) (HCC) - Plan: EKG 12-Lead Euvolemic, reports that she takes Lasix 20 mg daily Lab work reviewed, stable  Coronary artery disease involving native coronary artery of native heart without angina pectoris - Plan: EKG 12-Lead Denies anginal symptoms No further ischemic work-up at this time, cholesterol at goal  Essential hypertension - Plan: EKG 12-Lead Blood pressure stable, no changes made to her medications  Mixed hyperlipidemia - Plan: EKG 12-Lead Recent weight loss Tolerating Lipitor with Zetia Numbers at goal  Anxiety Reassured her that she is doing well Recommended regular exercise program Active   Total encounter time more than 25 minutes  Greater than 50% was spent in counseling and coordination of care with the patient   Disposition:   F/U  12 months   Orders Placed This Encounter  Procedures  . EKG 12-Lead     Signed, Dossie Arbour, M.D., Ph.D. 04/20/2018  [facility] [place], [place] [phone]

## 2018-04-20 ENCOUNTER — Ambulatory Visit (INDEPENDENT_AMBULATORY_CARE_PROVIDER_SITE_OTHER): Payer: Medicare Other | Admitting: Cardiovascular Disease

## 2018-04-20 ENCOUNTER — Encounter: Payer: Self-pay | Admitting: Cardiovascular Disease

## 2018-04-20 VITALS — BP 128/86 | HR 74 | Ht 64.0 in | Wt 160.2 lb

## 2018-04-20 DIAGNOSIS — E782 Mixed hyperlipidemia: Secondary | ICD-10-CM

## 2018-04-20 DIAGNOSIS — I5022 Chronic systolic (congestive) heart failure: Secondary | ICD-10-CM | POA: Diagnosis not present

## 2018-04-20 DIAGNOSIS — I10 Essential (primary) hypertension: Secondary | ICD-10-CM

## 2018-04-20 DIAGNOSIS — I25118 Atherosclerotic heart disease of native coronary artery with other forms of angina pectoris: Secondary | ICD-10-CM

## 2018-04-20 MED ORDER — ATORVASTATIN CALCIUM 40 MG PO TABS: 40.0000 mg | ORAL_TABLET | Freq: Every day | ORAL | 3 refills | Status: DC

## 2018-04-20 MED ORDER — METOPROLOL SUCCINATE ER 50 MG PO TB24: 50.0000 mg | ORAL_TABLET | Freq: Every day | ORAL | 3 refills | Status: DC

## 2018-04-20 NOTE — Patient Instructions (Addendum)
Medication Instructions:  No changes  If you need a refill on your cardiac medications before your next appointment, please call your pharmacy.    Lab work: CMP and lipids    If you have labs (blood work) drawn today and your tests are completely normal, you will receive your results only by: Marland Kitchen MyChart Message (if you have MyChart) OR . A paper copy in the mail If you have any lab test that is abnormal or we need to change your treatment, we will call you to review the results.   Testing/Procedures: No new testing needed   Follow-Up: At [facility], you and your health needs are our priority.  As part of our continuing mission to provide you with exceptional heart care, we have created designated Provider Care Teams.  These Care Teams include your primary Cardiologist (physician) and Advanced Practice Providers (APPs -  Physician Assistants and Nurse Practitioners) who all work together to provide you with the care you need, when you need it.  . You will need a follow up appointment in 12 months .   Please call our office 2 months in advance to schedule this appointment.    . Providers on your designated Care Team:   . Nicolasa Ducking, NP . Eula Listen, PA-C . Marisue Ivan, PA-C  Any Other Special Instructions Will Be Listed Below (If Applicable).  For educational health videos Log in to : [email or web address]  Or : [email or web address], password : [email or web address]

## 2018-04-21 LAB — COMPREHENSIVE METABOLIC PANEL
A/G RATIO: 2 (ref 1.2–2.2)
ALT: 37 IU/L — ABNORMAL HIGH (ref 0–32)
AST: 32 IU/L (ref 0–40)
Albumin: 4.6 g/dL (ref 3.6–4.6)
Alkaline Phosphatase: 77 IU/L (ref 39–117)
BUN / CREAT RATIO: 23 (ref 12–28)
BUN: 30 mg/dL — ABNORMAL HIGH (ref 8–27)
Bilirubin Total: 0.4 mg/dL (ref 0.0–1.2)
CO2: 23 mmol/L (ref 20–29)
Calcium: 10.4 mg/dL — ABNORMAL HIGH (ref 8.7–10.3)
Chloride: 105 mmol/L (ref 96–106)
Creatinine, Ser: 1.28 mg/dL — ABNORMAL HIGH (ref 0.57–1.00)
GFR calc non Af Amer: 39 mL/min/{1.73_m2} — ABNORMAL LOW (ref >59)
GFR, EST AFRICAN AMERICAN: 45 mL/min/{1.73_m2} — AB (ref >59)
Globulin, Total: 2.3 g/dL (ref 1.5–4.5)
Glucose: 124 mg/dL — ABNORMAL HIGH (ref 65–99)
Potassium: 5.1 mmol/L (ref 3.5–5.2)
Sodium: 144 mmol/L (ref 134–144)
Total Protein: 6.9 g/dL (ref 6.0–8.5)

## 2018-04-21 LAB — LIPID PANEL
Chol/HDL Ratio: 2.3 ratio (ref 0.0–4.4)
Cholesterol, Total: 138 mg/dL (ref 100–199)
HDL: 60 mg/dL (ref >39)
LDL Calculated: 62 mg/dL (ref 0–99)
Triglycerides: 81 mg/dL (ref 0–149)
VLDL Cholesterol Cal: 16 mg/dL (ref 5–40)

## 2018-04-25 ENCOUNTER — Telehealth: Payer: Self-pay | Admitting: *Deleted

## 2018-04-25 NOTE — Telephone Encounter (Signed)
Left voicemail message to call back for lab results.  

## 2018-04-25 NOTE — Telephone Encounter (Signed)
-----   Message from Anna Iba, MD sent at 04/22/2018  6:21 PM EST ----- Excellent lab work Better kidney function

## 2018-04-26 NOTE — Telephone Encounter (Signed)
Results released by provider. Pt has viewed in my chart. No further orders. Encounter completed at this time.

## 2018-04-28 ENCOUNTER — Other Ambulatory Visit: Payer: Self-pay | Admitting: Cardiovascular Disease

## 2018-04-28 ENCOUNTER — Telehealth: Payer: Self-pay | Admitting: Cardiovascular Disease

## 2018-04-28 MED ORDER — METOPROLOL SUCCINATE ER 50 MG PO TB24: ORAL_TABLET | ORAL | 3 refills | Status: DC

## 2018-04-28 NOTE — Telephone Encounter (Signed)
Patient saw Dr Mariah Milling on 04/20/18. Dr Mariah Milling sent in refill for metoprolol but did not put the correct signature line. Patient could not get a 90-day supply for the way she was taking it because of that. Advised patient I will go ahead and resend the prescription and then she can check with her pharmacy to see if they can give her any additional pills. She was appreciative.

## 2018-04-28 NOTE — Telephone Encounter (Signed)
Pt c/o medication issue:  1. Name of Medication: Metoprolol 50 mg    2. How are you currently taking this medication (dosage and times per day)?  1.0 tab in morning and 1.5 tab evening   3. Are you having a reaction (difficulty breathing--STAT)? No   4. What is your medication issue? Pharmacy received incorrect rx that patient worked so hard to have sent in correctly.  Please call to discuss and then send the correct rx to pharmacy

## 2018-05-04 ENCOUNTER — Other Ambulatory Visit: Payer: Self-pay | Admitting: Cardiovascular Disease

## 2018-08-04 ENCOUNTER — Other Ambulatory Visit: Payer: Self-pay | Admitting: Cardiovascular Disease

## 2018-12-07 DIAGNOSIS — Z23 Encounter for immunization: Secondary | ICD-10-CM | POA: Diagnosis not present

## 2019-02-04 ENCOUNTER — Other Ambulatory Visit: Payer: Self-pay | Admitting: Cardiovascular Disease

## 2019-04-11 ENCOUNTER — Other Ambulatory Visit: Payer: Self-pay | Admitting: Cardiovascular Disease

## 2019-04-25 ENCOUNTER — Ambulatory Visit: Payer: Medicare Other | Admitting: Cardiovascular Disease

## 2019-05-05 ENCOUNTER — Other Ambulatory Visit: Payer: Self-pay | Admitting: Cardiovascular Disease

## 2019-05-05 ENCOUNTER — Other Ambulatory Visit: Payer: Self-pay

## 2019-05-05 MED ORDER — ATORVASTATIN CALCIUM 40 MG PO TABS: ORAL_TABLET | ORAL | 0 refills | Status: DC

## 2019-05-05 MED ORDER — METOPROLOL SUCCINATE ER 50 MG PO TB24: ORAL_TABLET | ORAL | 0 refills | Status: DC

## 2019-08-02 ENCOUNTER — Other Ambulatory Visit: Payer: Self-pay | Admitting: Cardiovascular Disease

## 2019-08-02 ENCOUNTER — Telehealth: Payer: Self-pay

## 2019-08-02 ENCOUNTER — Other Ambulatory Visit: Payer: Self-pay

## 2019-08-02 NOTE — Telephone Encounter (Signed)
Attempted to schedule no ans no vm  

## 2019-08-02 NOTE — Telephone Encounter (Signed)
-----   Message from Margrett Rud, [place] sent at 08/02/2019 12:03 PM EDT ----- Regarding: Needs Appt. Fax from [facility] requesting a refill for CLONIDINE 0.1MG .   Patient was last seen 04/2018.  Patient will need an appointment for refills.

## 2019-08-03 ENCOUNTER — Other Ambulatory Visit: Payer: Self-pay | Admitting: Cardiovascular Disease

## 2019-08-11 ENCOUNTER — Encounter: Payer: Self-pay | Admitting: Family

## 2019-08-11 ENCOUNTER — Ambulatory Visit (INDEPENDENT_AMBULATORY_CARE_PROVIDER_SITE_OTHER): Payer: Medicare Other | Admitting: Family

## 2019-08-11 ENCOUNTER — Other Ambulatory Visit: Payer: Self-pay

## 2019-08-11 VITALS — BP 130/72 | HR 81 | Ht 64.0 in | Wt 164.2 lb

## 2019-08-11 DIAGNOSIS — I10 Essential (primary) hypertension: Secondary | ICD-10-CM

## 2019-08-11 DIAGNOSIS — E785 Hyperlipidemia, unspecified: Secondary | ICD-10-CM | POA: Diagnosis not present

## 2019-08-11 DIAGNOSIS — I25118 Atherosclerotic heart disease of native coronary artery with other forms of angina pectoris: Secondary | ICD-10-CM

## 2019-08-11 DIAGNOSIS — I5022 Chronic systolic (congestive) heart failure: Secondary | ICD-10-CM | POA: Diagnosis not present

## 2019-08-11 MED ORDER — AMLODIPINE BESYLATE 10 MG PO TABS: 10.0000 mg | ORAL_TABLET | Freq: Every day | ORAL | 3 refills | Status: DC

## 2019-08-11 MED ORDER — ATORVASTATIN CALCIUM 40 MG PO TABS: ORAL_TABLET | ORAL | 3 refills | Status: DC

## 2019-08-11 MED ORDER — LISINOPRIL 40 MG PO TABS: 40.0000 mg | ORAL_TABLET | Freq: Every day | ORAL | 3 refills | Status: DC

## 2019-08-11 MED ORDER — POTASSIUM CHLORIDE CRYS ER 10 MEQ PO TBCR: 10.0000 meq | EXTENDED_RELEASE_TABLET | Freq: Two times a day (BID) | ORAL | 3 refills | Status: DC | PRN

## 2019-08-11 MED ORDER — METOPROLOL SUCCINATE ER 50 MG PO TB24: ORAL_TABLET | ORAL | 3 refills | Status: DC

## 2019-08-11 MED ORDER — CLONIDINE HCL 0.1 MG PO TABS: 0.1000 mg | ORAL_TABLET | Freq: Two times a day (BID) | ORAL | 3 refills | Status: DC

## 2019-08-11 MED ORDER — FUROSEMIDE 20 MG PO TABS: 20.0000 mg | ORAL_TABLET | Freq: Two times a day (BID) | ORAL | 3 refills | Status: DC | PRN

## 2019-08-11 NOTE — Progress Notes (Signed)
Office Visit    Patient Name: Anna Weaver Date of Encounter: 08/11/2019  Primary Care Provider:  Birdie Sons, MD Primary Cardiologist:  Ida Rogue, MD Electrophysiologist:  None   Chief Complaint    Anna Weaver is a 86 y.o. female with a hx of HTN, HLD, CAD, HFrEF, anxiety presents today for annual follow-up of her heart failure and CAD.  Past Medical History    Past Medical History:  Diagnosis Date  . CAD (coronary artery disease)    a. LHC 05/2011 showed proximal LAD 100% stenosed, proximal LCx 20% stenosed, OM2 60% stenosed, proximal RCA 30% stenosed. Her proximal LAD lesion was felt to be the culprit lesion for her abnormal stress test. She was noted to have collaterals from the LCx and RCA to the LAD. EF 40%. Medical management was advised  . Chronic systolic CHF (congestive heart failure) ([place]) 03/04/2011   a. TTE 2013: EF of 20-25%, diffuse hypokinesis, mild mitral regurgitation, mildly dilated left atrium, mild tricuspid regurgitation, PASP 55 mmHg  . Hx of measles    childhood  . Hypertension   . Pulmonary hypertension ([place])   . Shingles 06/2015   Past Surgical History:  Procedure Laterality Date  . CARDIAC CATHETERIZATION  05/29/11  . TONSILLECTOMY  1943   Allergies  No Known Allergies  History of Present Illness    Anna Weaver is a 86 y.o. female with a hx of HFrEF, HTN, HLD, CAD, anxiety last seen 04/20/18 with Dr. Rockey Situ.  January 2013 she was noted to have systolic heart failure with EF of 25%.  She had stress test showing anterior wall ischemia.  She underwent subsequent catheterization at [facility] on 05/29/2011 showing occluded proximal LAD with collateral vessels, 60% OM 2 disease, EF 40%.  Reports feeling overall well. She enjoys quilting and working outside in her garden.  Endorses following a low-sodium, heart healthy diet.  She does not monitor her blood pressure cuff at home but does have a cuff if needed.  Reports no shortness of breath nor  dyspnea on exertion. Reports no chest pain, pressure, or tightness. No edema, orthopnea, PND. Reports no palpitations.   EKGs/Labs/Other Studies Reviewed:   The following studies were reviewed today:  EKG:  EKG is ordered today.  The ekg ordered today demonstrates NSR 81 bpm with moderate voltage criteria for LVH, nonspecific ST/T wave abnormality stable compared to previous.  Recent Labs: No results found for requested labs within last 8760 hours.  Recent Lipid Panel    Component Value Date/Time   CHOL 138 04/20/2018 1545   TRIG 81 04/20/2018 1545   HDL 60 04/20/2018 1545   CHOLHDL 2.3 04/20/2018 1545   LDLCALC 62 04/20/2018 1545    Home Medications   Current Meds  Medication Sig  . amLODipine (NORVASC) 10 MG tablet Take 1 tablet (10 mg total) by mouth daily.  Marland Kitchen aspirin 81 MG tablet Take 81 mg by mouth 2 (two) times daily.   Marland Kitchen atorvastatin (LIPITOR) 40 MG tablet TAKE 1 TABLET(40 MG) BY MOUTH DAILY  . cloNIDine (CATAPRES) 0.1 MG tablet Take 1 tablet (0.1 mg total) by mouth 2 (two) times daily.  Marland Kitchen ezetimibe (ZETIA) 10 MG tablet TAKE ONE TABLET BY MOUTH ONCE DAILY   . furosemide (LASIX) 20 MG tablet Take 1 tablet (20 mg total) by mouth 2 (two) times daily as needed.  Marland Kitchen lisinopril (ZESTRIL) 40 MG tablet Take 1 tablet (40 mg total) by mouth daily.  . metoprolol succinate (TOPROL-XL) 50 MG  24 hr tablet TAKE 1 TABLET BY MOUTH EVERY MORNING AND 1 AND 1/2 TABLETS EVERY EVENING AS DIRECTED  . Multiple Vitamins-Minerals (MULTIPLE VITAMINS/WOMENS PO) Take by mouth.  . potassium chloride (KLOR-CON) 10 MEQ tablet Take 1 tablet (10 mEq total) by mouth 2 (two) times daily as needed.  . [DISCONTINUED] amLODipine (NORVASC) 10 MG tablet Take 1 tablet (10 mg total) by mouth daily. Needs appointment for further refills. Thank you!  . [DISCONTINUED] atorvastatin (LIPITOR) 40 MG tablet TAKE 1 TABLET(40 MG) BY MOUTH DAILY  . [DISCONTINUED] cloNIDine (CATAPRES) 0.1 MG tablet TAKE 1 TABLET BY MOUTH TWICE  DAILY  . [DISCONTINUED] furosemide (LASIX) 20 MG tablet TAKE 1 TABLET BY MOUTH TWICE DAILY AS NEEDED  . [DISCONTINUED] lisinopril (ZESTRIL) 40 MG tablet TAKE 1 TABLET BY MOUTH EVERY DAY  . [DISCONTINUED] metoprolol succinate (TOPROL-XL) 50 MG 24 hr tablet TAKE 1 TABLET BY MOUTH EVERY MORNING AND 1 AND 1/2 TABLETS EVERY EVENING AS DIRECTED  . [DISCONTINUED] potassium chloride (KLOR-CON) 10 MEQ tablet TAKE 1 TABLET BY MOUTH TWICE DAILY AS NEEDED      Review of Systems   Review of Systems  Constitutional: Negative for chills, fever and malaise/fatigue.  Cardiovascular: Negative for chest pain, dyspnea on exertion, leg swelling, near-syncope, orthopnea, palpitations and syncope.  Respiratory: Negative for cough, shortness of breath and wheezing.   Gastrointestinal: Negative for nausea and vomiting.  Neurological: Negative for dizziness, light-headedness and weakness.   All other systems reviewed and are otherwise negative except as noted above.  Physical Exam    VS:  BP 130/72   Pulse 81   Ht 5\' 4"  (1.626 m)   Wt 164 lb 4 oz (74.5 kg)   SpO2 95%   BMI 28.19 kg/m  , BMI Body mass index is 28.19 kg/m. GEN: Well nourished, well developed, in no acute distress. HEENT: normal. Neck: Supple, no JVD, carotid bruits, or masses. Cardiac: RRR, no murmurs, rubs, or gallops. No clubbing, cyanosis, edema.  Radials/DP/PT 2+ and equal bilaterally.  Respiratory:  Respirations regular and unlabored, clear to auscultation bilaterally. GI: Soft, nontender, nondistended, BS + x 4. MS: No deformity or atrophy. Skin: Warm and dry, no rash. Neuro:  Strength and sensation are intact. Psych: Normal affect.  Assessment & Plan    1. Chronic systolic heart failure -euvolemic on and well compensated on exam.  GDMT includes Lasix 20 mg daily.  She has had multiple difficulties controlling her blood pressure with multiple intolerances to various antihypertensive agents and is understandably hesitant regarding  medication changes.  Will defer addition of ARB. BMP, CBC today.  2. CAD -stable no anginal symptoms.  EKG today with no acute ST/2 changes.  No indication for ischemic evaluation this time.  GDMT includes aspirin, beta-blocker, statin.  Continue low-sodium, healthy diet.  Continue regular cardiovascular exercise.  3. HTN -BP well controlled.  Continue present antihypertensive regimen.  Refills provided.  4. HLD -continue atorvastatin 40 mg daily and Zetia 10 mg daily..  LDL goal less than 70.  Lipid panel today.   Disposition: Follow up in 1 year(s) with Dr. or APP   Mariah Milling, NP 08/11/2019, 2:27 PM

## 2019-08-11 NOTE — Patient Instructions (Addendum)
Medication Instructions:  No medication changes today.   Refills of your medications were sent in today.   *If you need a refill on your cardiac medications before your next appointment, please call your pharmacy*  Lab Work: Your physician recommends that you return for lab work today: CMET, CBC, Lipid panel  If you have labs (blood work) drawn today and your tests are completely normal, you will receive your results only by: Marland Kitchen MyChart Message (if you have MyChart) OR . A paper copy in the mail If you have any lab test that is abnormal or we need to change your treatment, we will call you to review the results.  Testing/Procedures: Your EKG today shows normal sinus rhythm which is a stable finding.   Follow-Up: At [facility], you and your health needs are our priority.  As part of our continuing mission to provide you with exceptional heart care, we have created designated Provider Care Teams.  These Care Teams include your primary Cardiologist (physician) and Advanced Practice Providers (APPs -  Physician Assistants and Nurse Practitioners) who all work together to provide you with the care you need, when you need it.  We recommend signing up for the patient portal called "MyChart".  Sign up information is provided on this After Visit Summary.  MyChart is used to connect with patients for Virtual Visits (Telemedicine).  Patients are able to view lab/test results, encounter notes, upcoming appointments, etc.  Non-urgent messages can be sent to your provider as well.   To learn more about what you can do with MyChart, go to [email or web address].    Your next appointment:   1 year(s)  The format for your next appointment:   In Person  Provider:   You may see Julien Nordmann, MD or one of the following Advanced Practice Providers on your designated Care Team:    Nicolasa Ducking, NP  Eula Listen, PA-C  Marisue Ivan, PA-C  Gillian Shields, NP

## 2019-08-12 LAB — COMPREHENSIVE METABOLIC PANEL
ALT: 27 IU/L (ref 0–32)
AST: 24 IU/L (ref 0–40)
Albumin/Globulin Ratio: 1.6 (ref 1.2–2.2)
Albumin: 4.4 g/dL (ref 3.6–4.6)
Alkaline Phosphatase: 88 IU/L (ref 48–121)
BUN/Creatinine Ratio: 15 (ref 12–28)
BUN: 19 mg/dL (ref 8–27)
Bilirubin Total: 0.4 mg/dL (ref 0.0–1.2)
CO2: 24 mmol/L (ref 20–29)
Calcium: 10.5 mg/dL — ABNORMAL HIGH (ref 8.7–10.3)
Chloride: 104 mmol/L (ref 96–106)
Creatinine, Ser: 1.3 mg/dL — ABNORMAL HIGH (ref 0.57–1.00)
GFR calc Af Amer: 44 mL/min/{1.73_m2} — ABNORMAL LOW (ref >59)
GFR calc non Af Amer: 38 mL/min/{1.73_m2} — ABNORMAL LOW (ref >59)
Globulin, Total: 2.8 g/dL (ref 1.5–4.5)
Glucose: 129 mg/dL — ABNORMAL HIGH (ref 65–99)
Potassium: 5.2 mmol/L (ref 3.5–5.2)
Sodium: 142 mmol/L (ref 134–144)
Total Protein: 7.2 g/dL (ref 6.0–8.5)

## 2019-08-12 LAB — CBC WITH DIFFERENTIAL/PLATELET
Basophils Absolute: 0 10*3/uL (ref 0.0–0.2)
Basos: 0 %
EOS (ABSOLUTE): 0 10*3/uL (ref 0.0–0.4)
Eos: 0 %
Hematocrit: 48.7 % — ABNORMAL HIGH (ref 34.0–46.6)
Hemoglobin: 15.7 g/dL (ref 11.1–15.9)
Immature Grans (Abs): 0 10*3/uL (ref 0.0–0.1)
Immature Granulocytes: 0 %
Lymphocytes Absolute: 2.3 10*3/uL (ref 0.7–3.1)
Lymphs: 27 %
MCH: 29.2 pg (ref 26.6–33.0)
MCHC: 32.2 g/dL (ref 31.5–35.7)
MCV: 91 fL (ref 79–97)
Monocytes Absolute: 0.4 10*3/uL (ref 0.1–0.9)
Monocytes: 4 %
Neutrophils Absolute: 5.9 10*3/uL (ref 1.4–7.0)
Neutrophils: 69 %
Platelets: 264 10*3/uL (ref 150–450)
RBC: 5.37 x10E6/uL — ABNORMAL HIGH (ref 3.77–5.28)
RDW: 12.8 % (ref 11.7–15.4)
WBC: 8.6 10*3/uL (ref 3.4–10.8)

## 2019-08-12 LAB — LIPID PANEL
Chol/HDL Ratio: 2.2 ratio (ref 0.0–4.4)
Cholesterol, Total: 139 mg/dL (ref 100–199)
HDL: 64 mg/dL (ref >39)
LDL Chol Calc (NIH): 56 mg/dL (ref 0–99)
Triglycerides: 103 mg/dL (ref 0–149)
VLDL Cholesterol Cal: 19 mg/dL (ref 5–40)

## 2020-02-22 DIAGNOSIS — Z23 Encounter for immunization: Secondary | ICD-10-CM | POA: Diagnosis not present

## 2020-04-16 ENCOUNTER — Other Ambulatory Visit: Payer: Self-pay | Admitting: Cardiovascular Disease

## 2020-04-16 NOTE — Telephone Encounter (Signed)
*  STAT* If patient is at the pharmacy, call can be transferred to refill team.   1. Which medications need to be refilled? (please list name of each medication and dose if known) Zetia  2. Which pharmacy/location (including street and city if local pharmacy) is medication to be sent to? [facility]  3. Do they need a 30 day or 90 day supply? 1 year supply  Patient states he is using a Good Rx discount card

## 2020-04-16 NOTE — Telephone Encounter (Signed)
Rx request sent to pharmacy.  

## 2020-05-10 ENCOUNTER — Other Ambulatory Visit: Payer: Self-pay | Admitting: Family

## 2020-05-10 DIAGNOSIS — I10 Essential (primary) hypertension: Secondary | ICD-10-CM

## 2020-08-04 ENCOUNTER — Other Ambulatory Visit: Payer: Self-pay | Admitting: Family

## 2020-08-04 DIAGNOSIS — I5022 Chronic systolic (congestive) heart failure: Secondary | ICD-10-CM

## 2020-08-05 ENCOUNTER — Other Ambulatory Visit: Payer: Self-pay | Admitting: Family

## 2020-08-05 DIAGNOSIS — I5022 Chronic systolic (congestive) heart failure: Secondary | ICD-10-CM

## 2020-08-05 DIAGNOSIS — I10 Essential (primary) hypertension: Secondary | ICD-10-CM

## 2020-08-08 ENCOUNTER — Telehealth: Payer: Self-pay | Admitting: Cardiovascular Disease

## 2020-08-08 DIAGNOSIS — I10 Essential (primary) hypertension: Secondary | ICD-10-CM

## 2020-08-08 MED ORDER — CLONIDINE HCL 0.1 MG PO TABS: 0.1000 mg | ORAL_TABLET | Freq: Two times a day (BID) | ORAL | 3 refills | Status: DC

## 2020-08-08 MED ORDER — LISINOPRIL 40 MG PO TABS: 40.0000 mg | ORAL_TABLET | Freq: Every day | ORAL | 3 refills | Status: DC

## 2020-08-08 NOTE — Telephone Encounter (Signed)
*  STAT* If patient is at the pharmacy, call can be transferred to refill team.   1. Which medications need to be refilled? (please list name of each medication and dose if known)  Clonidine 0.1 MG 1 tablet 2 times daily  Lisinopril 40 MG 1 tablet daily   2. Which pharmacy/location (including street and city if local pharmacy) is medication to be sent to? [facility] on [address]   3. Do they need a 30 day or 90 day supply? 90 day

## 2020-08-20 ENCOUNTER — Ambulatory Visit (INDEPENDENT_AMBULATORY_CARE_PROVIDER_SITE_OTHER): Payer: Medicare Other | Admitting: Cardiovascular Disease

## 2020-08-20 ENCOUNTER — Other Ambulatory Visit
Admission: RE | Admit: 2020-08-20 | Discharge: 2020-08-20 | Disposition: A | Discharge status: Home / Self Care | Payer: Medicare Other | Source: Ambulatory Visit | Attending: Cardiovascular Disease | Admitting: Cardiovascular Disease

## 2020-08-20 ENCOUNTER — Other Ambulatory Visit: Payer: Self-pay

## 2020-08-20 ENCOUNTER — Encounter: Payer: Self-pay | Admitting: Cardiovascular Disease

## 2020-08-20 VITALS — BP 140/80 | HR 81 | Ht 64.0 in | Wt 159.5 lb

## 2020-08-20 DIAGNOSIS — I5022 Chronic systolic (congestive) heart failure: Secondary | ICD-10-CM | POA: Diagnosis not present

## 2020-08-20 DIAGNOSIS — Z79899 Other long term (current) drug therapy: Secondary | ICD-10-CM

## 2020-08-20 DIAGNOSIS — I25118 Atherosclerotic heart disease of native coronary artery with other forms of angina pectoris: Secondary | ICD-10-CM | POA: Insufficient documentation

## 2020-08-20 DIAGNOSIS — I10 Essential (primary) hypertension: Secondary | ICD-10-CM | POA: Diagnosis not present

## 2020-08-20 DIAGNOSIS — E785 Hyperlipidemia, unspecified: Secondary | ICD-10-CM | POA: Insufficient documentation

## 2020-08-20 LAB — CBC
HCT: 47.1 % — ABNORMAL HIGH (ref 36.0–46.0)
Hemoglobin: 15.4 g/dL — ABNORMAL HIGH (ref 12.0–15.0)
MCH: 29.4 pg (ref 26.0–34.0)
MCHC: 32.7 g/dL (ref 30.0–36.0)
MCV: 89.9 fL (ref 80.0–100.0)
Platelets: 234 10*3/uL (ref 150–400)
RBC: 5.24 MIL/uL — ABNORMAL HIGH (ref 3.87–5.11)
RDW: 13.8 % (ref 11.5–15.5)
WBC: 7.7 10*3/uL (ref 4.0–10.5)
nRBC: 0 % (ref 0.0–0.2)

## 2020-08-20 LAB — LIPID PANEL
Cholesterol: 145 mg/dL (ref 0–200)
HDL: 56 mg/dL (ref >40)
LDL Cholesterol: 71 mg/dL (ref 0–99)
Total CHOL/HDL Ratio: 2.6 RATIO
Triglycerides: 88 mg/dL (ref <150)
VLDL: 18 mg/dL (ref 0–40)

## 2020-08-20 LAB — COMPREHENSIVE METABOLIC PANEL
ALT: 27 U/L (ref 0–44)
AST: 26 U/L (ref 15–41)
Albumin: 4.1 g/dL (ref 3.5–5.0)
Alkaline Phosphatase: 68 U/L (ref 38–126)
Anion gap: 6 (ref 5–15)
BUN: 28 mg/dL — ABNORMAL HIGH (ref 8–23)
CO2: 29 mmol/L (ref 22–32)
Calcium: 10.2 mg/dL (ref 8.9–10.3)
Chloride: 105 mmol/L (ref 98–111)
Creatinine, Ser: 1.33 mg/dL — ABNORMAL HIGH (ref 0.44–1.00)
GFR, Estimated: 39 mL/min — ABNORMAL LOW (ref >60)
Glucose, Bld: 138 mg/dL — ABNORMAL HIGH (ref 70–99)
Potassium: 4.5 mmol/L (ref 3.5–5.1)
Sodium: 140 mmol/L (ref 135–145)
Total Bilirubin: 0.7 mg/dL (ref 0.3–1.2)
Total Protein: 7.3 g/dL (ref 6.5–8.1)

## 2020-08-20 MED ORDER — EZETIMIBE 10 MG PO TABS: 10.0000 mg | ORAL_TABLET | Freq: Every day | ORAL | 1 refills | Status: DC

## 2020-08-20 MED ORDER — FUROSEMIDE 20 MG PO TABS: ORAL_TABLET | ORAL | 3 refills | Status: DC

## 2020-08-20 MED ORDER — METOPROLOL SUCCINATE ER 50 MG PO TB24: ORAL_TABLET | ORAL | 3 refills | Status: DC

## 2020-08-20 MED ORDER — AMLODIPINE BESYLATE 10 MG PO TABS: ORAL_TABLET | ORAL | 3 refills | Status: DC

## 2020-08-20 MED ORDER — ATORVASTATIN CALCIUM 40 MG PO TABS: ORAL_TABLET | ORAL | 3 refills | Status: DC

## 2020-08-20 MED ORDER — POTASSIUM CHLORIDE CRYS ER 10 MEQ PO TBCR: EXTENDED_RELEASE_TABLET | ORAL | 3 refills | Status: DC

## 2020-08-20 NOTE — Progress Notes (Signed)
Cardiology Office Note  Date:  08/20/2020   ID:  Anna Weaver, DOB 1933-04-16, MRN [id number]  PCP:  Malva Limes, MD   Chief Complaint  Patient presents with   12 month follow up     "Doing well." Medications reviewed by the patient verbally.     HPI:  Anna Weaver is a very pleasant 87 year-old woman with  hyperlipidemia,   Jan 2013, found to be in systolic CHF,  EF 25%,  started on Lasix with a significant weight loss over 2013,  Stress test showing anterior wall ischemia,  cardiac catheterization at [facility] on 05/29/2011 showing occluded proximal LAD with collateral vessels, also 60% OM 2 disease, ejection fraction 40%. Previously declined echo She presents for follow-up of her coronary artery disease and systolic dysfunction  Feels well, Active, no regular exercise program No edema Weight down 4 pounds No PND orthopnea Denies anginal symptoms  Blood pressure well controlled at home Tolerating Lipitor and Zetia  Previously reluctant to add medications Previously declined echo   EKG personally reviewed by myself on todays visit Shows normal sinus rhythm with rate 81 bpm no significant ST or T wave changes   Other past medical history reviewed  echocardiogram January 2013 showed ejection fraction less than 25%. Right ventricular systolic pressures were significantly elevated, estimated at 60 mm of mercury or higher.    PMH:   has a past medical history of CAD (coronary artery disease), Chronic systolic CHF (congestive heart failure) (HCC) (03/04/2011), measles, Hypertension, Pulmonary hypertension (HCC), and Shingles (06/2015).  PSH:    Past Surgical History:  Procedure Laterality Date   CARDIAC CATHETERIZATION  05/29/11   TONSILLECTOMY  1943    Current Outpatient Medications  Medication Sig Dispense Refill   amLODipine (NORVASC) 10 MG tablet TAKE 1 TABLET(10 MG) BY MOUTH DAILY 90 tablet 0   aspirin 81 MG tablet Take 81 mg by mouth 2 (two) times daily.       atorvastatin (LIPITOR) 40 MG tablet TAKE 1 TABLET(40 MG) BY MOUTH DAILY 90 tablet 3   cloNIDine (CATAPRES) 0.1 MG tablet Take 1 tablet (0.1 mg total) by mouth 2 (two) times daily. 180 tablet 3   ezetimibe (ZETIA) 10 MG tablet TAKE ONE TABLET BY MOUTH ONCE DAILY 90 tablet 3   furosemide (LASIX) 20 MG tablet TAKE 1 TABLET(20 MG) BY MOUTH TWICE DAILY AS NEEDED 180 tablet 0   lisinopril (ZESTRIL) 40 MG tablet Take 1 tablet (40 mg total) by mouth daily. 90 tablet 3   metoprolol succinate (TOPROL-XL) 50 MG 24 hr tablet TAKE 1 TABLET BY MOUTH EVERY MORNING AND 1 AND 1/2 TABLETS EVERY EVENING AS DIRECTED 225 tablet 3   Multiple Vitamins-Minerals (MULTIPLE VITAMINS/WOMENS PO) Take by mouth.     potassium chloride (KLOR-CON) 10 MEQ tablet TAKE 1 TABLET(10 MEQ) BY MOUTH TWICE DAILY AS NEEDED 180 tablet 0   No current facility-administered medications for this visit.     Allergies:   Patient has no known allergies.   Social History:  The patient  reports that she has never smoked. She has never used smokeless tobacco. She reports that she does not drink alcohol and does not use drugs.   Family History:   family history includes Heart disease in her father; Hypertension in her brother, sister, and sister; Other in her mother.    Review of Systems: Review of Systems  Constitutional: Negative.   Respiratory: Negative.    Cardiovascular: Negative.   Gastrointestinal: Negative.   Musculoskeletal: Negative.  Neurological: Negative.   Psychiatric/Behavioral: Negative.    All other systems reviewed and are negative.   PHYSICAL EXAM: VS:  BP 140/80 (BP Location: Left Arm, Patient Position: Sitting, Cuff Size: Normal)   Pulse 81   Ht 5\' 4"  (1.626 m)   Wt 159 lb 8 oz (72.3 kg)   SpO2 97%   BMI 27.38 kg/m  , BMI Body mass index is 27.38 kg/m. GEN: Well nourished, well developed, in no acute distress  HEENT: normal  Neck: no JVD, carotid bruits, or masses Cardiac: RRR; no murmurs, rubs, or  gallops,no edema  Respiratory:  clear to auscultation bilaterally, normal work of breathing GI: soft, nontender, nondistended, + BS MS: no deformity or atrophy  Skin: warm and dry, no rash Neuro:  Strength and sensation are intact Psych: euthymic mood, full affect  Recent Labs: No results found for requested labs within last 8760 hours.    Lipid Panel Lab Results  Component Value Date   CHOL 139 08/11/2019   HDL 64 08/11/2019   LDLCALC 56 08/11/2019   TRIG 103 08/11/2019      Wt Readings from Last 3 Encounters:  08/20/20 159 lb 8 oz (72.3 kg)  08/11/19 164 lb 4 oz (74.5 kg)  04/20/18 160 lb 4 oz (72.7 kg)     ASSESSMENT AND PLAN:  Cardiomyopathy/chronic systolic CHF (congestive heart failure) (HCC) - Plan: EKG 12-Lead Euvolemic, reports that she takes Lasix 20 mg daily Lab work reviewed, stable Has been reluctant to add additional medications  Coronary artery disease involving native coronary artery of native heart without angina pectoris - Plan: EKG 12-Lead Denies anginal symptoms No further ischemic work-up at this time, cholesterol at goal  Essential hypertension - Plan: EKG 12-Lead Blood pressure stable, no changes made to her medications  Mixed hyperlipidemia - Plan: EKG 12-Lead Weight stable to trending downward Tolerating Lipitor with Zetia Numbers at goal  Anxiety Recommended regular exercise program Recommend she try to stay active   Total encounter time more than 25 minutes  Greater than 50% was spent in counseling and coordination of care with the patient    No orders of the defined types were placed in this encounter.    Signed, 06/20/18, M.D., Ph.D. 08/20/2020  [facility] [place], [place]

## 2020-08-20 NOTE — Patient Instructions (Addendum)
Medication Instructions:  No changes, please continue your current medications   If you need a refill on your cardiac medications before your next appointment, please call your pharmacy.   Lab work: CMP,CBC, lipids  Testing/Procedures: No new testing needed   Follow-Up: At [facility], you and your health needs are our priority.  As part of our continuing mission to provide you with exceptional heart care, we have created designated Provider Care Teams.  These Care Teams include your primary Cardiologist (physician) and Advanced Practice Providers (APPs -  Physician Assistants and Nurse Practitioners) who all work together to provide you with the care you need, when you need it.  You will need a follow up appointment in 12 months  Providers on your designated Care Team:   Nicolasa Ducking, NP Eula Listen, PA-C Marisue Ivan, PA-C Cadence [place], [place]  COVID-19 Vaccine Information can be found at: [email or web address] For questions related to vaccine distribution or appointments, please email vaccine@ [email or web address] or call [phone].

## 2020-08-30 NOTE — Progress Notes (Signed)
Pt's lab results mailed to pt, pt is not utilizing his MyChart account to see results   

## 2020-11-01 ENCOUNTER — Other Ambulatory Visit: Payer: Self-pay | Admitting: Family

## 2020-11-01 DIAGNOSIS — E785 Hyperlipidemia, unspecified: Secondary | ICD-10-CM

## 2021-08-04 ENCOUNTER — Other Ambulatory Visit: Payer: Self-pay | Admitting: Cardiovascular Disease

## 2021-08-04 ENCOUNTER — Other Ambulatory Visit: Payer: Self-pay | Admitting: Family

## 2021-08-04 DIAGNOSIS — I10 Essential (primary) hypertension: Secondary | ICD-10-CM

## 2021-08-04 DIAGNOSIS — I25118 Atherosclerotic heart disease of native coronary artery with other forms of angina pectoris: Secondary | ICD-10-CM

## 2021-08-04 DIAGNOSIS — I5022 Chronic systolic (congestive) heart failure: Secondary | ICD-10-CM

## 2021-08-04 NOTE — Telephone Encounter (Signed)
Pt of Dr. Gollan. Please review for refill. Thank you! 

## 2021-08-08 ENCOUNTER — Other Ambulatory Visit: Payer: Self-pay | Admitting: Cardiovascular Disease

## 2021-08-08 DIAGNOSIS — E785 Hyperlipidemia, unspecified: Secondary | ICD-10-CM

## 2021-08-23 NOTE — Progress Notes (Signed)
Cardiology Office Note  Date:  08/23/2021   ID:  Anna Weaver, DOB 18-Jun-1933, MRN [id number]  PCP:  Malva Limes, MD   No chief complaint on file.   HPI:  [name] is a very pleasant 88 year-old woman with  hyperlipidemia,   Jan 2013, found to be in systolic CHF,  EF 25%,  started on Lasix with a significant weight loss over 2013,  Stress test showing anterior wall ischemia,  cardiac catheterization at [facility] on 05/29/2011 showing occluded proximal LAD with collateral vessels, also 60% OM 2 disease, ejection fraction 40%. Previously declined echo She presents for follow-up of her coronary artery disease and systolic dysfunction  LOV 7/22    Feels well, Active, no regular exercise program No edema Weight down 4 pounds No PND orthopnea Denies anginal symptoms  Blood pressure well controlled at home Tolerating Lipitor and Zetia  Previously reluctant to add medications Previously declined echo   EKG personally reviewed by myself on todays visit Shows normal sinus rhythm with rate 81 bpm no significant ST or T wave changes   Other past medical history reviewed  echocardiogram January 2013 showed ejection fraction less than 25%. Right ventricular systolic pressures were significantly elevated, estimated at 60 mm of mercury or higher.    PMH:   has a past medical history of CAD (coronary artery disease), Chronic systolic CHF (congestive heart failure) (HCC) (03/04/2011), measles, Hypertension, Pulmonary hypertension (HCC), and Shingles (06/2015).  PSH:    Past Surgical History:  Procedure Laterality Date   CARDIAC CATHETERIZATION  05/29/11   TONSILLECTOMY  1943    Current Outpatient Medications  Medication Sig Dispense Refill   amLODipine (NORVASC) 10 MG tablet TAKE 1 TABLET(10 MG) BY MOUTH Weaver 90 tablet 3   aspirin 81 MG tablet Take 81 mg by mouth 2 (two) times Weaver.      atorvastatin (LIPITOR) 40 MG tablet TAKE 1 TABLET(40 MG) BY MOUTH Weaver 90 tablet 0    cloNIDine (CATAPRES) 0.1 MG tablet TAKE 1 TABLET(0.1 MG) BY MOUTH TWICE Weaver 180 tablet 0   ezetimibe (ZETIA) 10 MG tablet Take 1 tablet (10 mg total) by mouth Weaver. 360 tablet 1   furosemide (LASIX) 20 MG tablet TAKE 1 TABLET(20 MG) BY MOUTH TWICE Weaver AS NEEDED 180 tablet 3   lisinopril (ZESTRIL) 40 MG tablet TAKE 1 TABLET(40 MG) BY MOUTH Weaver 90 tablet 0   metoprolol succinate (TOPROL-XL) 50 MG 24 hr tablet TAKE 1 TABLET BY MOUTH EVERY MORNING AND 1 1/2 TABLET BY MOUTH EVERY EVENING AS DIRECTED 225 tablet 0   Multiple Vitamins-Minerals (MULTIPLE VITAMINS/WOMENS PO) Take by mouth.     potassium chloride (KLOR-CON) 10 MEQ tablet TAKE 1 TABLET(10 MEQ) BY MOUTH TWICE Weaver AS NEEDED 180 tablet 3   No current facility-administered medications for this visit.     Allergies:   Patient has no known allergies.   Social History:  The patient  reports that she has never smoked. She has never used smokeless tobacco. She reports that she does not drink alcohol and does not use drugs.   Family History:   family history includes Heart disease in her father; Hypertension in her brother, sister, and sister; Other in her mother.    Review of Systems: Review of Systems  Constitutional: Negative.   Respiratory: Negative.    Cardiovascular: Negative.   Gastrointestinal: Negative.   Musculoskeletal: Negative.   Neurological: Negative.   Psychiatric/Behavioral: Negative.    All other systems reviewed and are negative.  PHYSICAL EXAM: VS:  There were no vitals taken for this visit. , BMI There is no height or weight on file to calculate BMI. GEN: Well nourished, well developed, in no acute distress  HEENT: normal  Neck: no JVD, carotid bruits, or masses Cardiac: RRR; no murmurs, rubs, or gallops,no edema  Respiratory:  clear to auscultation bilaterally, normal work of breathing GI: soft, nontender, nondistended, + BS MS: no deformity or atrophy  Skin: warm and dry, no rash Neuro:  Strength  and sensation are intact Psych: euthymic mood, full affect  Recent Labs: No results found for requested labs within last 365 days.    Lipid Panel Lab Results  Component Value Date   CHOL 145 08/20/2020   HDL 56 08/20/2020   LDLCALC 71 08/20/2020   TRIG 88 08/20/2020      Wt Readings from Last 3 Encounters:  08/20/20 159 lb 8 oz (72.3 kg)  08/11/19 164 lb 4 oz (74.5 kg)  04/20/18 160 lb 4 oz (72.7 kg)     ASSESSMENT AND PLAN:  Cardiomyopathy/chronic systolic CHF (congestive heart failure) (HCC) - Plan: EKG 12-Lead Euvolemic, reports that she takes Lasix 20 mg Weaver Lab work reviewed, stable Has been reluctant to add additional medications  Coronary artery disease involving native coronary artery of native heart without angina pectoris - Plan: EKG 12-Lead Denies anginal symptoms No further ischemic work-up at this time, cholesterol at goal  Essential hypertension - Plan: EKG 12-Lead Blood pressure stable, no changes made to her medications  Mixed hyperlipidemia - Plan: EKG 12-Lead Weight stable to trending downward Tolerating Lipitor with Zetia Numbers at goal  Anxiety Recommended regular exercise program Recommend she try to stay active   Total encounter time more than 25 minutes  Greater than 50% was spent in counseling and coordination of care with the patient    No orders of the defined types were placed in this encounter.    Signed, Dossie Arbour, M.D., Ph.D. 08/23/2021  [facility] [place], [place] [phone]

## 2021-08-25 ENCOUNTER — Encounter: Payer: Self-pay | Admitting: Cardiovascular Disease

## 2021-08-25 ENCOUNTER — Ambulatory Visit (INDEPENDENT_AMBULATORY_CARE_PROVIDER_SITE_OTHER): Payer: Medicare Other | Admitting: Cardiovascular Disease

## 2021-08-25 ENCOUNTER — Other Ambulatory Visit
Admission: RE | Admit: 2021-08-25 | Discharge: 2021-08-25 | Disposition: A | Discharge status: Home / Self Care | Payer: Medicare Other | Source: Ambulatory Visit | Attending: Cardiovascular Disease | Admitting: Cardiovascular Disease

## 2021-08-25 VITALS — BP 140/80 | HR 83 | Ht 64.0 in | Wt 164.4 lb

## 2021-08-25 DIAGNOSIS — I5022 Chronic systolic (congestive) heart failure: Secondary | ICD-10-CM

## 2021-08-25 DIAGNOSIS — E785 Hyperlipidemia, unspecified: Secondary | ICD-10-CM

## 2021-08-25 DIAGNOSIS — I25118 Atherosclerotic heart disease of native coronary artery with other forms of angina pectoris: Secondary | ICD-10-CM

## 2021-08-25 DIAGNOSIS — Z79899 Other long term (current) drug therapy: Secondary | ICD-10-CM | POA: Insufficient documentation

## 2021-08-25 DIAGNOSIS — I10 Essential (primary) hypertension: Secondary | ICD-10-CM

## 2021-08-25 LAB — COMPREHENSIVE METABOLIC PANEL
ALT: 33 U/L (ref 0–44)
AST: 29 U/L (ref 15–41)
Albumin: 4 g/dL (ref 3.5–5.0)
Alkaline Phosphatase: 85 U/L (ref 38–126)
Anion gap: 10 (ref 5–15)
BUN: 27 mg/dL — ABNORMAL HIGH (ref 8–23)
CO2: 25 mmol/L (ref 22–32)
Calcium: 9.9 mg/dL (ref 8.9–10.3)
Chloride: 105 mmol/L (ref 98–111)
Creatinine, Ser: 1.17 mg/dL — ABNORMAL HIGH (ref 0.44–1.00)
GFR, Estimated: 46 mL/min — ABNORMAL LOW (ref >60)
Glucose, Bld: 121 mg/dL — ABNORMAL HIGH (ref 70–99)
Potassium: 4 mmol/L (ref 3.5–5.1)
Sodium: 140 mmol/L (ref 135–145)
Total Bilirubin: 0.9 mg/dL (ref 0.3–1.2)
Total Protein: 7.2 g/dL (ref 6.5–8.1)

## 2021-08-25 LAB — LIPID PANEL
Cholesterol: 126 mg/dL (ref 0–200)
HDL: 50 mg/dL (ref >40)
LDL Cholesterol: 62 mg/dL (ref 0–99)
Total CHOL/HDL Ratio: 2.5 RATIO
Triglycerides: 70 mg/dL (ref <150)
VLDL: 14 mg/dL (ref 0–40)

## 2021-08-25 LAB — TSH: TSH: 1.757 u[IU]/mL (ref 0.350–4.500)

## 2021-08-25 MED ORDER — ATORVASTATIN CALCIUM 40 MG PO TABS: 40.0000 mg | ORAL_TABLET | Freq: Every day | ORAL | 3 refills | Status: DC

## 2021-08-25 MED ORDER — AMLODIPINE BESYLATE 10 MG PO TABS: ORAL_TABLET | ORAL | 3 refills | Status: DC

## 2021-08-25 MED ORDER — EZETIMIBE 10 MG PO TABS: 10.0000 mg | ORAL_TABLET | Freq: Every day | ORAL | 3 refills | Status: DC

## 2021-08-25 NOTE — Patient Instructions (Addendum)
Medication Instructions:  No changes  If you need a refill on your cardiac medications before your next appointment, please call your pharmacy.   Lab work: Today: CMP, TSH, lipid profile   Testing/Procedures: No new testing needed  Follow-Up: At [facility], you and your health needs are our priority.  As part of our continuing mission to provide you with exceptional heart care, we have created designated Provider Care Teams.  These Care Teams include your primary Cardiologist (physician) and Advanced Practice Providers (APPs -  Physician Assistants and Nurse Practitioners) who all work together to provide you with the care you need, when you need it.  You will need a follow up appointment in 12 months  Providers on your designated Care Team:   Nicolasa Ducking, NP Eula Listen, PA-C Cadence Fransico [place], [place]  COVID-19 Vaccine Information can be found at: [email or web address].nl For questions related to vaccine distribution or appointments, please email [email or web address] or call [phone].

## 2021-10-28 ENCOUNTER — Other Ambulatory Visit: Payer: Self-pay | Admitting: Cardiovascular Disease

## 2021-10-28 DIAGNOSIS — I10 Essential (primary) hypertension: Secondary | ICD-10-CM

## 2021-10-30 ENCOUNTER — Other Ambulatory Visit: Payer: Self-pay | Admitting: Cardiovascular Disease

## 2021-10-30 DIAGNOSIS — I5022 Chronic systolic (congestive) heart failure: Secondary | ICD-10-CM

## 2021-10-31 ENCOUNTER — Other Ambulatory Visit: Payer: Self-pay | Admitting: Cardiovascular Disease

## 2021-10-31 DIAGNOSIS — I10 Essential (primary) hypertension: Secondary | ICD-10-CM

## 2021-10-31 DIAGNOSIS — I5022 Chronic systolic (congestive) heart failure: Secondary | ICD-10-CM

## 2021-11-03 ENCOUNTER — Other Ambulatory Visit: Payer: Self-pay | Admitting: Cardiovascular Disease

## 2021-11-03 DIAGNOSIS — I5022 Chronic systolic (congestive) heart failure: Secondary | ICD-10-CM

## 2021-11-03 DIAGNOSIS — I10 Essential (primary) hypertension: Secondary | ICD-10-CM

## 2021-11-03 DIAGNOSIS — I25118 Atherosclerotic heart disease of native coronary artery with other forms of angina pectoris: Secondary | ICD-10-CM

## 2022-01-27 ENCOUNTER — Other Ambulatory Visit: Payer: Self-pay

## 2022-01-27 DIAGNOSIS — I10 Essential (primary) hypertension: Secondary | ICD-10-CM

## 2022-01-27 MED ORDER — CLONIDINE HCL 0.1 MG PO TABS: ORAL_TABLET | ORAL | 1 refills | Status: DC

## 2022-01-27 MED ORDER — LISINOPRIL 40 MG PO TABS: ORAL_TABLET | ORAL | 1 refills | Status: DC

## 2022-04-27 ENCOUNTER — Other Ambulatory Visit: Payer: Self-pay | Admitting: Cardiovascular Disease

## 2022-04-27 DIAGNOSIS — E785 Hyperlipidemia, unspecified: Secondary | ICD-10-CM

## 2022-07-27 ENCOUNTER — Other Ambulatory Visit: Payer: Self-pay | Admitting: Cardiovascular Disease

## 2022-07-27 DIAGNOSIS — E785 Hyperlipidemia, unspecified: Secondary | ICD-10-CM

## 2022-07-28 ENCOUNTER — Telehealth: Payer: Self-pay | Admitting: Cardiovascular Disease

## 2022-07-28 NOTE — Telephone Encounter (Signed)
Left a message for the patient to call back.  

## 2022-07-28 NOTE — Telephone Encounter (Signed)
.  Pt c/o medication issue:  1. Name of Medication:  ezetimibe (ZETIA) 10 MG tablet  2. How are you currently taking this medication (dosage and times per day)?   3. Are you having a reaction (difficulty breathing--STAT)?   4. What is your medication issue?   Patient's husband states the patient normally gets refills with her prescription. He would like to add 3 refills onto her current prescription if possible.

## 2022-07-29 ENCOUNTER — Other Ambulatory Visit: Payer: Self-pay | Admitting: Cardiovascular Disease

## 2022-07-29 DIAGNOSIS — I10 Essential (primary) hypertension: Secondary | ICD-10-CM

## 2022-07-30 NOTE — Telephone Encounter (Signed)
Spoke with patient and informed her that the current Rx will take her to her appointment Tuesday 08/25/22 and that if she needs refills sent in that they can be sent at that time. Patient was satisfied with the response.

## 2022-08-06 ENCOUNTER — Other Ambulatory Visit: Payer: Self-pay | Admitting: Cardiovascular Disease

## 2022-08-06 DIAGNOSIS — I5022 Chronic systolic (congestive) heart failure: Secondary | ICD-10-CM

## 2022-08-06 DIAGNOSIS — I25118 Atherosclerotic heart disease of native coronary artery with other forms of angina pectoris: Secondary | ICD-10-CM

## 2022-08-06 DIAGNOSIS — I10 Essential (primary) hypertension: Secondary | ICD-10-CM

## 2022-08-24 NOTE — Progress Notes (Unsigned)
Cardiology Office Note  Date:  08/25/2022   ID:  Anna Weaver, DOB 1934-01-20, MRN [id number]  PCP:  Malva Limes, MD   Chief Complaint  Patient presents with   12 month follow up     "Doing well." Medications reviewed by the patient verbally.     HPI:  Ms. Wahlman is a very pleasant 89 year-old woman with  hyperlipidemia,   Jan 2013, found to be in systolic CHF,  EF 25%,  started on Lasix with a significant weight loss over 2013,  Stress test showing anterior wall ischemia,  cardiac catheterization at [facility] on 05/29/2011 showing occluded proximal LAD with collateral vessels, also 60% OM 2 disease, ejection fraction 40%. Previously declined repeat echo She presents for follow-up of her coronary artery disease and systolic dysfunction  LOV 7/23 In follow-up today reports that she feels well Denies any chest pain or shortness of breath concerning for angina Active in the house, no regular exercise program Takes care of husband who has bilateral knee osteoarthritis  Tolerating Zetia and Lipitor, cholesterol at goal 120 Chronic lower extremity edema, not bothered by it Does not want to change her medications Up and down the stairs to her basement all the time  Previously declined repeat echo  EKG personally reviewed by myself on todays visit EKG Interpretation Date/Time:  Tuesday August 25 2022 13:38:46 EDT Ventricular Rate:  71 PR Interval:  184 QRS Duration:  72 QT Interval:  396 QTC Calculation: 430 R Axis:   -25  Text Interpretation: Normal sinus rhythm Minimal voltage criteria for LVH, may be normal variant ( R in aVL ) No previous ECGs available Confirmed by Julien Nordmann [phone]) on 08/25/2022 1:55:58 PM   Other past medical history reviewed  echocardiogram January 2013 showed ejection fraction less than 25%. Right ventricular systolic pressures were significantly elevated, estimated at 60 mm of mercury or higher.    PMH:   has a past medical history of CAD (coronary  artery disease), Chronic systolic CHF (congestive heart failure) (HCC) (03/04/2011), measles, Hypertension, Pulmonary hypertension (HCC), and Shingles (06/2015).  PSH:    Past Surgical History:  Procedure Laterality Date   CARDIAC CATHETERIZATION  05/29/11   TONSILLECTOMY  1943    Current Outpatient Medications  Medication Sig Dispense Refill   amLODipine (NORVASC) 10 MG tablet TAKE 1 TABLET(10 MG) BY MOUTH DAILY 90 tablet 3   aspirin 81 MG tablet Take 81 mg by mouth 2 (two) times daily.      atorvastatin (LIPITOR) 40 MG tablet Take 1 tablet (40 mg total) by mouth daily. 90 tablet 3   cloNIDine (CATAPRES) 0.1 MG tablet TAKE 1 TABLET(0.1 MG) BY MOUTH TWICE DAILY 180 tablet 1   ezetimibe (ZETIA) 10 MG tablet TAKE ONE TABLET BY MOUTH ONCE DAILY 90 tablet 0   furosemide (LASIX) 20 MG tablet TAKE 1 TABLET(20 MG) BY MOUTH TWICE DAILY AS NEEDED 180 tablet 3   lisinopril (ZESTRIL) 40 MG tablet TAKE 1 TABLET(40 MG) BY MOUTH DAILY 90 tablet 1   metoprolol succinate (TOPROL-XL) 50 MG 24 hr tablet TAKE 1 TABLET BY MOUTH EVERY MORNING AND 1 1/2 TABLETS EVERY EVENING 225 tablet 0   Multiple Vitamins-Minerals (MULTIPLE VITAMINS/WOMENS PO) Take by mouth.     potassium chloride (KLOR-CON M) 10 MEQ tablet TAKE 1 TABLET(10 MEQ) BY MOUTH TWICE DAILY AS NEEDED 180 tablet 3   No current facility-administered medications for this visit.    Allergies:   Patient has no known allergies.   Social  History:  The patient  reports that she has never smoked. She has never used smokeless tobacco. She reports that she does not drink alcohol and does not use drugs.   Family History:   family history includes Heart disease in her father; Hypertension in her brother, sister, and sister; Other in her mother.    Review of Systems: Review of Systems  Constitutional: Negative.   Respiratory: Negative.    Cardiovascular: Negative.   Gastrointestinal: Negative.   Musculoskeletal: Negative.   Neurological: Negative.    Psychiatric/Behavioral: Negative.    All other systems reviewed and are negative.   PHYSICAL EXAM: VS:  BP 130/70 (BP Location: Left Arm, Patient Position: Sitting, Cuff Size: Normal)   Pulse 71   Ht 5\' 4"  (1.626 m)   Wt 164 lb 2 oz (74.4 kg)   SpO2 93%   BMI 28.17 kg/m  , BMI Body mass index is 28.17 kg/m. Constitutional:  oriented to person, place, and time. No distress.  HENT:  Head: Grossly normal Eyes:  no discharge. No scleral icterus.  Neck: No JVD, no carotid bruits  Cardiovascular: Regular rate and rhythm, no murmurs appreciated Pulmonary/Chest: Clear to auscultation bilaterally, no wheezes or rails Abdominal: Soft.  no distension.  no tenderness.  Musculoskeletal: Normal range of motion Neurological:  normal muscle tone. Coordination normal. No atrophy Skin: Skin warm and dry Psychiatric: normal affect, pleasant  Recent Labs: 08/25/2021: ALT 33; BUN 27; Creatinine, Ser 1.17; Potassium 4.0; Sodium 140; TSH 1.757    Lipid Panel Lab Results  Component Value Date   CHOL 126 08/25/2021   HDL 50 08/25/2021   LDLCALC 62 08/25/2021   TRIG 70 08/25/2021    Wt Readings from Last 3 Encounters:  08/25/22 164 lb 2 oz (74.4 kg)  08/25/21 164 lb 6.4 oz (74.6 kg)  08/20/20 159 lb 8 oz (72.3 kg)     ASSESSMENT AND PLAN:  Cardiomyopathy/chronic systolic CHF (congestive heart failure) (HCC) -  Stable, euvolemic Lasix 20 mg daily Chronic lower extremity edema likely exacerbated by venous insufficiency and side effect from amlodipine previously declined echo Prefers no medication changes at this time  Coronary artery disease involving native coronary artery of native heart without angina pectoris - Plan: EKG 12-Lead Currently with no symptoms of angina. No further workup at this time. Continue current medication regimen.  Essential hypertension - Plan: EKG 12-Lead Blood pressure is well controlled on today's visit. No changes made to the medications.  Mixed  hyperlipidemia - Plan: EKG 12-Lead Tolerating Lipitor with Zetia  Cholesterol at goal  Anxiety Recommended regular exercise program Reports feeling well with no complaints   Total encounter time more than 30 minutes  Greater than 50% was spent in counseling and coordination of care with the patient    Orders Placed This Encounter  Procedures   EKG 12-Lead     Signed, Dossie Arbour, M.D., Ph.D. 08/25/2022  [facility] [place], [place] [phone]

## 2022-08-25 ENCOUNTER — Encounter: Payer: Self-pay | Admitting: Cardiovascular Disease

## 2022-08-25 ENCOUNTER — Ambulatory Visit: Payer: Medicare Other | Attending: Cardiovascular Disease | Admitting: Cardiovascular Disease

## 2022-08-25 VITALS — BP 130/70 | HR 71 | Ht 64.0 in | Wt 164.1 lb

## 2022-08-25 DIAGNOSIS — I10 Essential (primary) hypertension: Secondary | ICD-10-CM | POA: Insufficient documentation

## 2022-08-25 DIAGNOSIS — I25118 Atherosclerotic heart disease of native coronary artery with other forms of angina pectoris: Secondary | ICD-10-CM | POA: Diagnosis not present

## 2022-08-25 DIAGNOSIS — I5022 Chronic systolic (congestive) heart failure: Secondary | ICD-10-CM | POA: Diagnosis not present

## 2022-08-25 DIAGNOSIS — E785 Hyperlipidemia, unspecified: Secondary | ICD-10-CM | POA: Insufficient documentation

## 2022-08-25 NOTE — Patient Instructions (Signed)
Medication Instructions:  No changes  If you need a refill on your cardiac medications before your next appointment, please call your pharmacy.   Lab work: No new labs needed  Testing/Procedures: No new testing needed  Follow-Up: At [facility], you and your health needs are our priority.  As part of our continuing mission to provide you with exceptional heart care, we have created designated Provider Care Teams.  These Care Teams include your primary Cardiologist (physician) and Advanced Practice Providers (APPs -  Physician Assistants and Nurse Practitioners) who all work together to provide you with the care you need, when you need it.  You will need a follow up appointment in 12 months  Providers on your designated Care Team:   Christopher Berge, NP Ryan Dunn, PA-C Cadence Furth, PA-C  COVID-19 Vaccine Information can be found at: [email or web address] For questions related to vaccine distribution or appointments, please email [email or web address] or call [phone].   

## 2022-11-06 ENCOUNTER — Other Ambulatory Visit: Payer: Self-pay | Admitting: Cardiovascular Disease

## 2022-11-06 DIAGNOSIS — I5022 Chronic systolic (congestive) heart failure: Secondary | ICD-10-CM

## 2022-11-06 DIAGNOSIS — E785 Hyperlipidemia, unspecified: Secondary | ICD-10-CM

## 2022-11-06 DIAGNOSIS — I10 Essential (primary) hypertension: Secondary | ICD-10-CM

## 2022-11-11 ENCOUNTER — Other Ambulatory Visit: Payer: Self-pay | Admitting: Cardiovascular Disease

## 2022-11-11 ENCOUNTER — Telehealth: Payer: Self-pay | Admitting: Cardiovascular Disease

## 2022-11-11 DIAGNOSIS — I25118 Atherosclerotic heart disease of native coronary artery with other forms of angina pectoris: Secondary | ICD-10-CM

## 2022-11-11 DIAGNOSIS — E785 Hyperlipidemia, unspecified: Secondary | ICD-10-CM

## 2022-11-11 DIAGNOSIS — I5022 Chronic systolic (congestive) heart failure: Secondary | ICD-10-CM

## 2022-11-11 DIAGNOSIS — I10 Essential (primary) hypertension: Secondary | ICD-10-CM

## 2022-11-11 NOTE — Telephone Encounter (Signed)
*  STAT* If patient is at the pharmacy, call can be transferred to refill team.   1. Which medications need to be refilled? (please list name of each medication and dose if known) ezetimibe (ZETIA) 10 MG tablet   2. Which pharmacy/location (including street and city if local pharmacy) is medication to be sent to?[id number] - [place], [place] [phone]   3. Do they need a 30 day or 90 day supply? 90 day

## 2022-11-12 MED ORDER — EZETIMIBE 10 MG PO TABS: 10.0000 mg | ORAL_TABLET | Freq: Every day | ORAL | 2 refills | Status: DC

## 2022-11-12 NOTE — Telephone Encounter (Signed)
Requested Prescriptions   Signed Prescriptions Disp Refills   ezetimibe (ZETIA) 10 MG tablet 90 tablet 2    Sig: Take 1 tablet (10 mg total) by mouth daily.    Authorizing Provider: Antonieta Iba    Ordering User: Guerry Minors

## 2023-06-05 ENCOUNTER — Other Ambulatory Visit: Payer: Self-pay | Admitting: Cardiovascular Disease

## 2023-06-05 DIAGNOSIS — I10 Essential (primary) hypertension: Secondary | ICD-10-CM

## 2023-06-05 DIAGNOSIS — I25118 Atherosclerotic heart disease of native coronary artery with other forms of angina pectoris: Secondary | ICD-10-CM

## 2023-06-05 DIAGNOSIS — I5022 Chronic systolic (congestive) heart failure: Secondary | ICD-10-CM

## 2023-07-29 ENCOUNTER — Other Ambulatory Visit: Payer: Self-pay | Admitting: Cardiovascular Disease

## 2023-07-29 DIAGNOSIS — I10 Essential (primary) hypertension: Secondary | ICD-10-CM

## 2023-07-29 DIAGNOSIS — E785 Hyperlipidemia, unspecified: Secondary | ICD-10-CM

## 2023-08-11 ENCOUNTER — Other Ambulatory Visit: Payer: Self-pay | Admitting: Cardiovascular Disease

## 2023-08-11 DIAGNOSIS — E785 Hyperlipidemia, unspecified: Secondary | ICD-10-CM

## 2023-08-29 NOTE — Progress Notes (Deleted)
 NO SHOW

## 2023-08-30 ENCOUNTER — Ambulatory Visit: Attending: Cardiovascular Disease | Admitting: Cardiovascular Disease

## 2023-08-30 DIAGNOSIS — I420 Dilated cardiomyopathy: Secondary | ICD-10-CM

## 2023-08-30 DIAGNOSIS — I25118 Atherosclerotic heart disease of native coronary artery with other forms of angina pectoris: Secondary | ICD-10-CM

## 2023-08-30 DIAGNOSIS — I10 Essential (primary) hypertension: Secondary | ICD-10-CM

## 2023-08-30 DIAGNOSIS — E782 Mixed hyperlipidemia: Secondary | ICD-10-CM

## 2023-08-30 DIAGNOSIS — E785 Hyperlipidemia, unspecified: Secondary | ICD-10-CM

## 2023-08-30 DIAGNOSIS — R0602 Shortness of breath: Secondary | ICD-10-CM

## 2023-08-30 DIAGNOSIS — I5022 Chronic systolic (congestive) heart failure: Secondary | ICD-10-CM

## 2023-11-08 ENCOUNTER — Other Ambulatory Visit: Payer: Self-pay | Admitting: Cardiovascular Disease

## 2023-11-08 DIAGNOSIS — I10 Essential (primary) hypertension: Secondary | ICD-10-CM

## 2023-11-08 DIAGNOSIS — E785 Hyperlipidemia, unspecified: Secondary | ICD-10-CM

## 2023-11-09 ENCOUNTER — Other Ambulatory Visit: Payer: Self-pay | Admitting: Cardiovascular Disease

## 2023-11-09 DIAGNOSIS — I5022 Chronic systolic (congestive) heart failure: Secondary | ICD-10-CM

## 2023-11-09 DIAGNOSIS — I10 Essential (primary) hypertension: Secondary | ICD-10-CM

## 2023-11-10 ENCOUNTER — Other Ambulatory Visit: Payer: Self-pay | Admitting: Cardiovascular Disease

## 2023-11-10 DIAGNOSIS — I5022 Chronic systolic (congestive) heart failure: Secondary | ICD-10-CM

## 2023-11-12 ENCOUNTER — Telehealth: Payer: Self-pay | Admitting: Cardiovascular Disease

## 2023-11-12 NOTE — Telephone Encounter (Signed)
 [facility] received fax for records request but they have not seen patient since 2014. Caller mentioned that they are also a part of [facility] and any records they do have may be visible in chart but patient is no longer established with them.

## 2023-11-28 NOTE — Progress Notes (Unsigned)
 Cardiology Office Note  Date:  11/29/2023   ID:  Anna Weaver, DOB Jul 09, 1933, MRN [id number]  PCP:  Anna Nancyann BRAVO, MD   Chief Complaint  Patient presents with   12 month follow up     Denies chest pain or shortness of breath.     HPI:  Anna Weaver is a very pleasant 89 year-old woman with  hyperlipidemia,  Jan 2013, found to be in systolic CHF,  EF 25%,  started on Lasix  with a significant weight loss over 2013,  Stress test showing anterior wall ischemia,  cardiac catheterization at [facility] on 05/29/2011 showing occluded proximal LAD with collateral vessels, also 60% OM 2 disease, ejection fraction 40%. Previously declined repeat echo She presents for follow-up of her coronary artery disease and systolic dysfunction  LOV 7/24 feels well Denies SOB, no chest pain BP stable  husband reports she is very active at baseline Trace lower extremity swelling, stable Unclear if amlodipine  contributing Takes Lasix  20 daily with potassium 10 daily  Remains on Lipitor 40 daily with Zetia  10 daily  Requesting refills Would prefer 360 of Zetia  for price  Takes care of husband who has bilateral knee osteoarthritis  Does not want to change her medications  Previously declined repeat echo  EKG personally reviewed by myself on todays visit EKG Interpretation Date/Time:  Monday November 29 2023 14:41:17 EDT Ventricular Rate:  73 PR Interval:  174 QRS Duration:  74 QT Interval:  392 QTC Calculation: 431 R Axis:   -26  Text Interpretation: Normal sinus rhythm Minimal voltage criteria for LVH, may be normal variant ( R in aVL ) When compared with ECG of 25-Aug-2022 13:38, No significant change was found Confirmed by Anna Weaver [phone]) on 11/29/2023 3:16:48 PM   Other past medical history reviewed  echocardiogram January 2013 showed ejection fraction less than 25%. Right ventricular systolic pressures were significantly elevated, estimated at 60 mm of mercury or higher.    PMH:    has a past medical history of CAD (coronary artery disease), Chronic systolic CHF (congestive heart failure) (HCC) (03/04/2011), measles, Hypertension, Pulmonary hypertension (HCC), and Shingles (06/2015).  PSH:    Past Surgical History:  Procedure Laterality Date   CARDIAC CATHETERIZATION  05/29/11   TONSILLECTOMY  1943    Current Outpatient Medications  Medication Sig Dispense Refill   aspirin 81 MG tablet Take 81 mg by mouth 2 (two) times daily.      Multiple Vitamins-Minerals (MULTIPLE VITAMINS/WOMENS PO) Take by mouth.     amLODipine  (NORVASC ) 10 MG tablet TAKE 1 TABLET(10 MG) BY MOUTH DAILY 90 tablet 3   atorvastatin  (LIPITOR) 40 MG tablet Take 1 tablet (40 mg total) by mouth daily. 90 tablet 3   cloNIDine  (CATAPRES ) 0.1 MG tablet TAKE 1 TABLET(0.1 MG) BY MOUTH TWICE DAILY 180 tablet 3   ezetimibe  (ZETIA ) 10 MG tablet Take 1 tablet (10 mg total) by mouth daily. 360 tablet 1   furosemide  (LASIX ) 20 MG tablet Take 1 tablet (20 mg total) by mouth daily. 90 tablet 3   lisinopril  (ZESTRIL ) 40 MG tablet TAKE 1 TABLET(40 MG) BY MOUTH DAILY 90 tablet 3   metoprolol  succinate (TOPROL -XL) 50 MG 24 hr tablet TAKE 1 TABLET BY MOUTH EVERY MORNING AND 1 1/2 TABLETS BY MOUTH EVERY EVENING 225 tablet 3   potassium chloride  (KLOR-CON ) 10 MEQ tablet Take 1 tablet (10 mEq total) by mouth daily. 90 tablet 3   No current facility-administered medications for this visit.    Allergies:  Patient has no known allergies.   Social History:  The patient  reports that she has never smoked. She has never used smokeless tobacco. She reports that she does not drink alcohol and does not use drugs.   Family History:   family history includes Heart disease in her father; Hypertension in her brother, sister, and sister; Other in her mother.    Review of Systems: Review of Systems  Constitutional: Negative.   Respiratory: Negative.    Cardiovascular: Negative.   Gastrointestinal: Negative.   Musculoskeletal:  Negative.   Neurological: Negative.   Psychiatric/Behavioral: Negative.    All other systems reviewed and are negative.   PHYSICAL EXAM: VS:  BP 130/72 (BP Location: Left Arm, Patient Position: Sitting, Cuff Size: Normal)   Pulse 73   Ht 5' 4 (1.626 m)   Wt 160 lb 2 oz (72.6 kg)   SpO2 90%   BMI 27.49 kg/m  , BMI Body mass index is 27.49 kg/m. Constitutional:  oriented to person, place, and time. No distress.  HENT:  Head: Grossly normal Eyes:  no discharge. No scleral icterus.  Neck: No JVD, no carotid bruits  Cardiovascular: Regular rate and rhythm, no murmurs appreciated Pulmonary/Chest: Clear to auscultation bilaterally, no wheezes or rales Abdominal: Soft.  no distension.  no tenderness.  Musculoskeletal: Normal range of motion Neurological:  normal muscle tone. Coordination normal. No atrophy Skin: Skin warm and dry Psychiatric: normal affect, pleasant   Recent Labs: No results found for requested labs within last 365 days.    Lipid Panel Lab Results  Component Value Date   CHOL 126 08/25/2021   HDL 50 08/25/2021   LDLCALC 62 08/25/2021   TRIG 70 08/25/2021    Wt Readings from Last 3 Encounters:  11/29/23 160 lb 2 oz (72.6 kg)  08/25/22 164 lb 2 oz (74.4 kg)  08/25/21 164 lb 6.4 oz (74.6 kg)     ASSESSMENT AND PLAN:  Cardiomyopathy/chronic systolic CHF (congestive heart failure) (HCC) -  Stable, euvolemic Lasix  20 mg daily with potassium 10 daily Chronic lower extremity edema likely exacerbated by venous insufficiency and side effect from amlodipine  previously declined echo Prefers no changes Ideally would like to wean off amlodipine  in the setting of leg swelling, low ejection fraction but will continue current regiment for now as it is working well for her  Coronary artery disease involving native coronary artery of native heart without angina pectoris - Plan: EKG 12-Lead Currently with no symptoms of angina. No further workup at this time. Continue  current medication regimen.  Essential hypertension - Plan: EKG 12-Lead Blood pressure is well controlled on today's visit. No changes made to the medications.  Mixed hyperlipidemia - Plan: EKG 12-Lead Tolerating Lipitor with Zetia   Cholesterol at goal  Anxiety Recommended regular exercise program Stable, doing well  Lab work ordered today  Orders Placed This Encounter  Procedures   EKG 12-Lead     Signed, Velinda Lunger, M.D., Ph.D. 11/29/2023  [facility] [place], [place] [phone]

## 2023-11-29 ENCOUNTER — Encounter: Payer: Self-pay | Admitting: Cardiovascular Disease

## 2023-11-29 ENCOUNTER — Ambulatory Visit: Attending: Cardiovascular Disease | Admitting: Cardiovascular Disease

## 2023-11-29 VITALS — BP 130/72 | HR 73 | Ht 64.0 in | Wt 160.1 lb

## 2023-11-29 DIAGNOSIS — I5022 Chronic systolic (congestive) heart failure: Secondary | ICD-10-CM | POA: Diagnosis not present

## 2023-11-29 DIAGNOSIS — I10 Essential (primary) hypertension: Secondary | ICD-10-CM | POA: Insufficient documentation

## 2023-11-29 DIAGNOSIS — Z79899 Other long term (current) drug therapy: Secondary | ICD-10-CM | POA: Insufficient documentation

## 2023-11-29 DIAGNOSIS — I25118 Atherosclerotic heart disease of native coronary artery with other forms of angina pectoris: Secondary | ICD-10-CM | POA: Insufficient documentation

## 2023-11-29 DIAGNOSIS — E785 Hyperlipidemia, unspecified: Secondary | ICD-10-CM | POA: Diagnosis present

## 2023-11-29 DIAGNOSIS — R0602 Shortness of breath: Secondary | ICD-10-CM | POA: Diagnosis present

## 2023-11-29 MED ORDER — LISINOPRIL 40 MG PO TABS: ORAL_TABLET | ORAL | 3 refills | Status: AC

## 2023-11-29 MED ORDER — FUROSEMIDE 20 MG PO TABS: 20.0000 mg | ORAL_TABLET | Freq: Every day | ORAL | 3 refills | Status: AC

## 2023-11-29 MED ORDER — POTASSIUM CHLORIDE ER 10 MEQ PO TBCR: 10.0000 meq | EXTENDED_RELEASE_TABLET | Freq: Every day | ORAL | 3 refills | Status: AC

## 2023-11-29 MED ORDER — EZETIMIBE 10 MG PO TABS: 10.0000 mg | ORAL_TABLET | Freq: Every day | ORAL | 1 refills | Status: AC

## 2023-11-29 MED ORDER — METOPROLOL SUCCINATE ER 50 MG PO TB24: ORAL_TABLET | ORAL | 3 refills | Status: AC

## 2023-11-29 MED ORDER — CLONIDINE HCL 0.1 MG PO TABS: ORAL_TABLET | ORAL | 3 refills | Status: AC

## 2023-11-29 MED ORDER — ATORVASTATIN CALCIUM 40 MG PO TABS: 40.0000 mg | ORAL_TABLET | Freq: Every day | ORAL | 3 refills | Status: AC

## 2023-11-29 MED ORDER — AMLODIPINE BESYLATE 10 MG PO TABS: ORAL_TABLET | ORAL | 3 refills | Status: AC

## 2023-11-29 NOTE — Patient Instructions (Addendum)
 Medication Instructions:  No changes  If you need a refill on your cardiac medications before your next appointment, please call your pharmacy.   Lab work: Lipids, CMP, CBC today  Testing/Procedures: No new testing needed  Follow-Up: At [facility], you and your health needs are our priority.  As part of our continuing mission to provide you with exceptional heart care, we have created designated Provider Care Teams.  These Care Teams include your primary Cardiologist (physician) and Advanced Practice Providers (APPs -  Physician Assistants and Nurse Practitioners) who all work together to provide you with the care you need, when you need it.  You will need a follow up appointment in 12 months  Providers on your designated Care Team:   Lonni Meager, NP Bernardino Bring, PA-C Cadence Franchester, [place]  COVID-19 Vaccine Information can be found at: [email or web address] For questions related to vaccine distribution or appointments, please email [email or web address] or call [phone].

## 2023-11-30 LAB — CBC
Hematocrit: 46.5 % (ref 34.0–46.6)
Hemoglobin: 15.2 g/dL (ref 11.1–15.9)
MCH: 30.1 pg (ref 26.6–33.0)
MCHC: 32.7 g/dL (ref 31.5–35.7)
MCV: 92 fL (ref 79–97)
Platelets: 261 x10E3/uL (ref 150–450)
RBC: 5.05 x10E6/uL (ref 3.77–5.28)
RDW: 13.1 % (ref 11.7–15.4)
WBC: 9.7 x10E3/uL (ref 3.4–10.8)

## 2023-11-30 LAB — COMPREHENSIVE METABOLIC PANEL WITH GFR
ALT: 41 IU/L — ABNORMAL HIGH (ref 0–32)
AST: 27 IU/L (ref 0–40)
Albumin: 4.3 g/dL (ref 3.7–4.7)
Alkaline Phosphatase: 91 IU/L (ref 48–129)
BUN/Creatinine Ratio: 17 (ref 12–28)
BUN: 20 mg/dL (ref 8–27)
Bilirubin Total: 0.5 mg/dL (ref 0.0–1.2)
CO2: 24 mmol/L (ref 20–29)
Calcium: 10.3 mg/dL (ref 8.7–10.3)
Chloride: 102 mmol/L (ref 96–106)
Creatinine, Ser: 1.18 mg/dL — ABNORMAL HIGH (ref 0.57–1.00)
Globulin, Total: 2.5 g/dL (ref 1.5–4.5)
Glucose: 124 mg/dL — ABNORMAL HIGH (ref 70–99)
Potassium: 4.4 mmol/L (ref 3.5–5.2)
Sodium: 142 mmol/L (ref 134–144)
Total Protein: 6.8 g/dL (ref 6.0–8.5)
eGFR: 45 mL/min/1.73 — ABNORMAL LOW (ref >59)

## 2023-11-30 LAB — LIPID PANEL
Chol/HDL Ratio: 2.4 ratio (ref 0.0–4.4)
Cholesterol, Total: 148 mg/dL (ref 100–199)
HDL: 61 mg/dL (ref >39)
LDL Chol Calc (NIH): 69 mg/dL (ref 0–99)
Triglycerides: 101 mg/dL (ref 0–149)
VLDL Cholesterol Cal: 18 mg/dL (ref 5–40)

## 2023-12-04 ENCOUNTER — Ambulatory Visit: Payer: Self-pay | Admitting: Cardiovascular Disease
